# Patient Record
Sex: Male | Born: 1975 | Race: White | Hispanic: No
Health system: Midwestern US, Academic
[De-identification: ages and names within clinical notes are randomized; demographics above are authoritative.]

## PROBLEM LIST (undated history)

## (undated) DIAGNOSIS — F17200 Nicotine dependence, unspecified, uncomplicated: Secondary | ICD-10-CM

## (undated) DIAGNOSIS — Z794 Long term (current) use of insulin: Secondary | ICD-10-CM

## (undated) DIAGNOSIS — E119 Type 2 diabetes mellitus without complications: Principal | ICD-10-CM

## (undated) DIAGNOSIS — E118 Type 2 diabetes mellitus with unspecified complications: Principal | ICD-10-CM

---

## 2014-11-30 IMAGING — CR MASTOIDS 3 VWS
2 series · 7 of 7 positions shown · non-contrast
Comparison: None.

HISTORY: Mastoiditis, left side.
TECHNIQUE: Plain x-rays of mastoids. 7 views.

[Series 1: stenvers · 0.17mm/px · 6 of 6 slices shown]
[im 1/6]
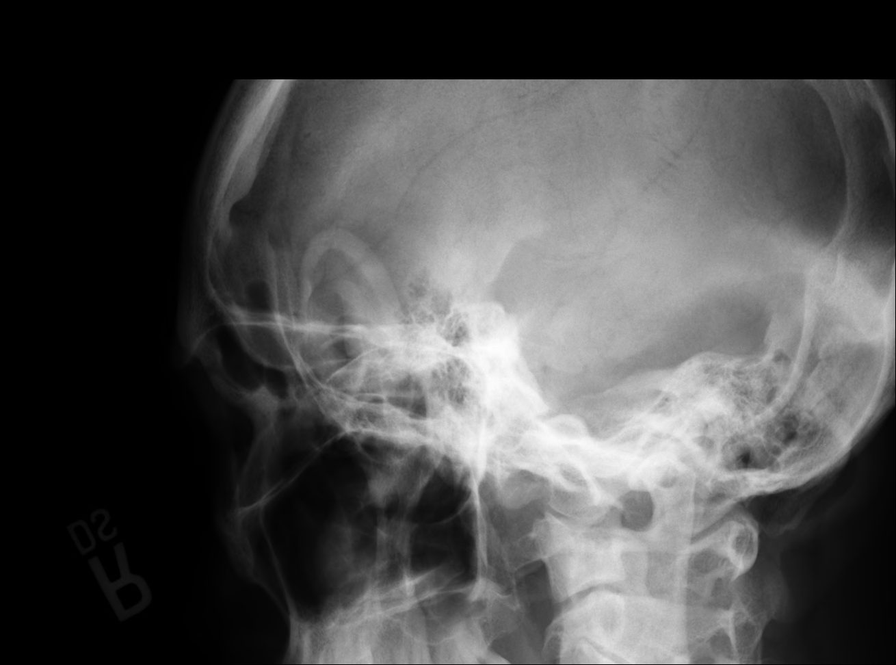
[im 2/6]
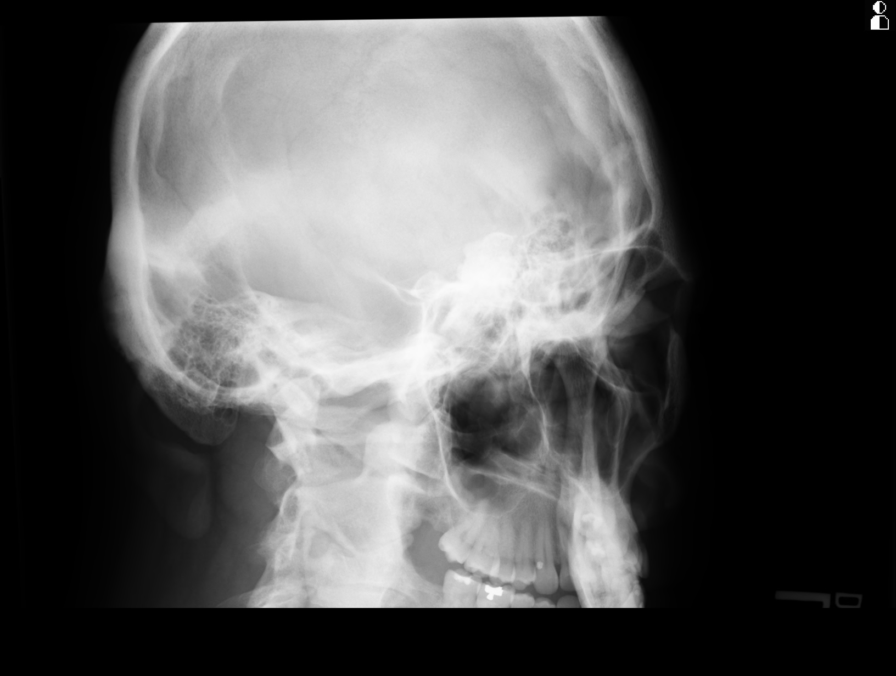
[im 3/6]
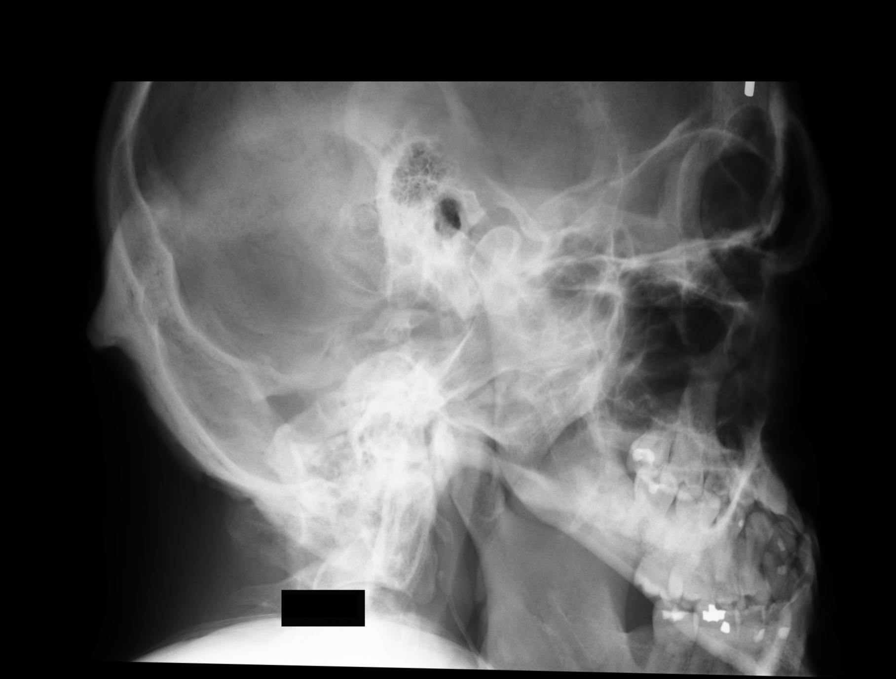
[im 4/6]
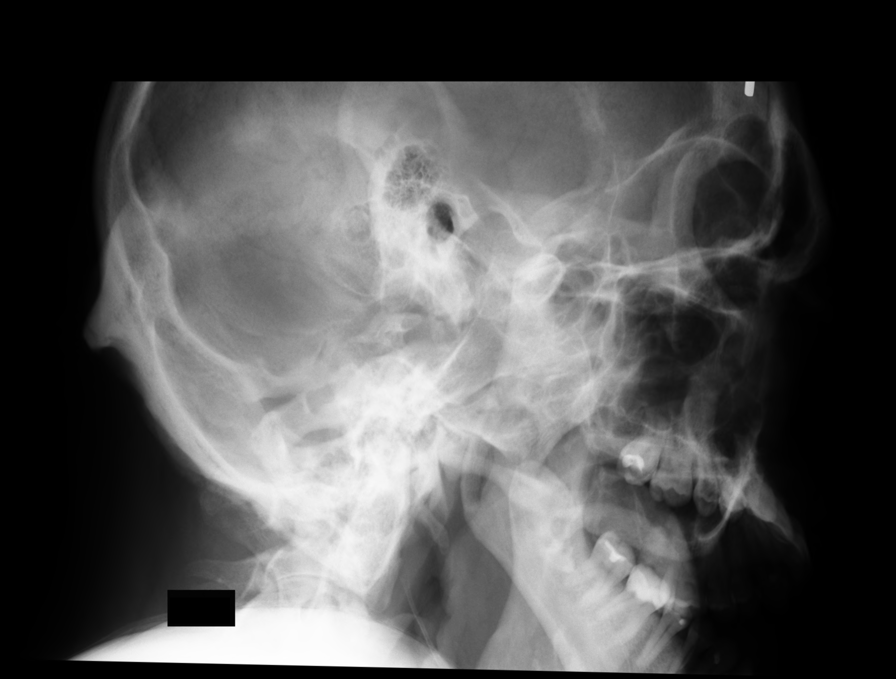
[im 5/6]
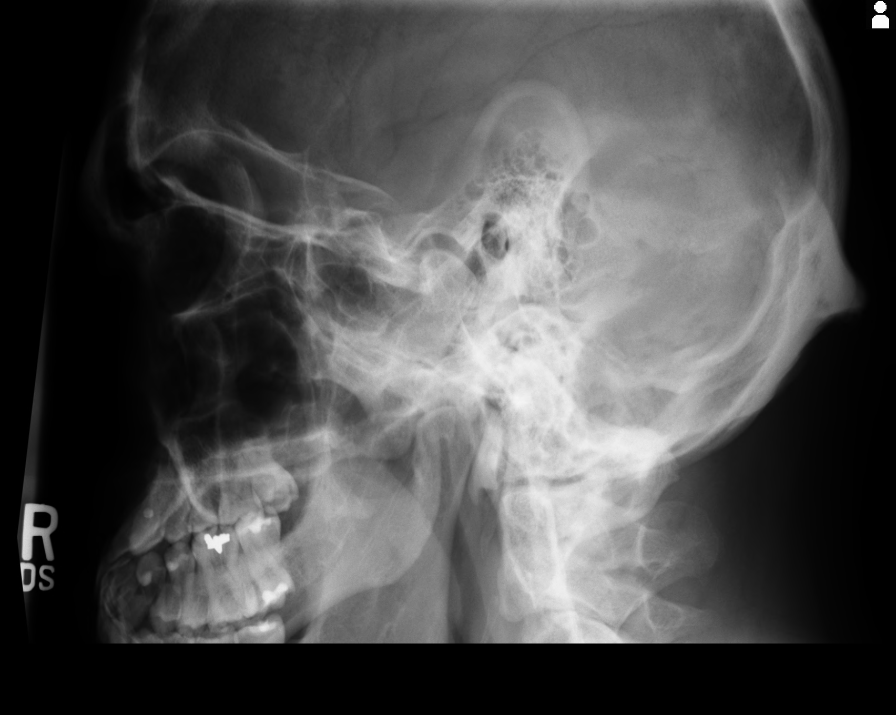
[im 6/6]
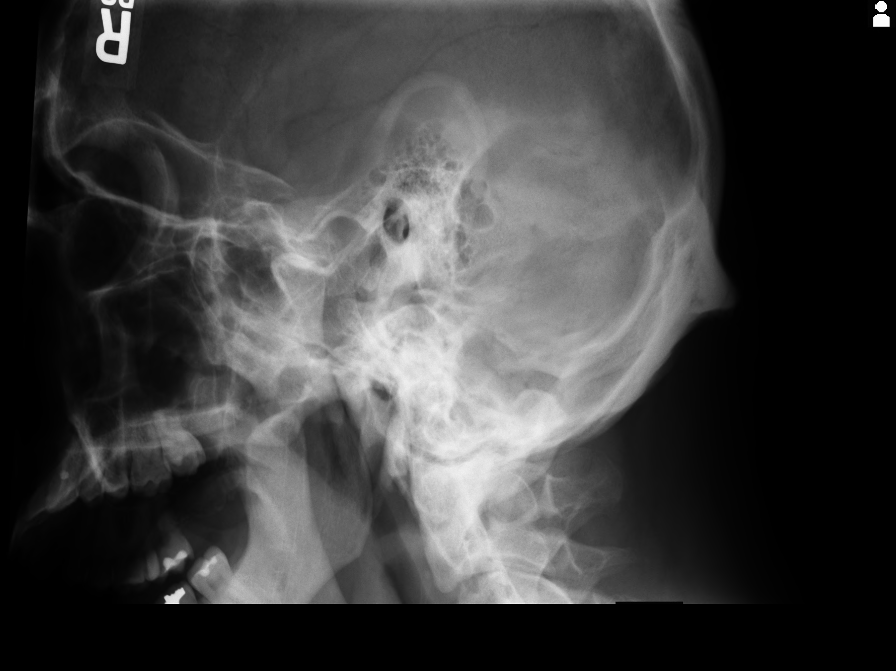

[[person_name]]
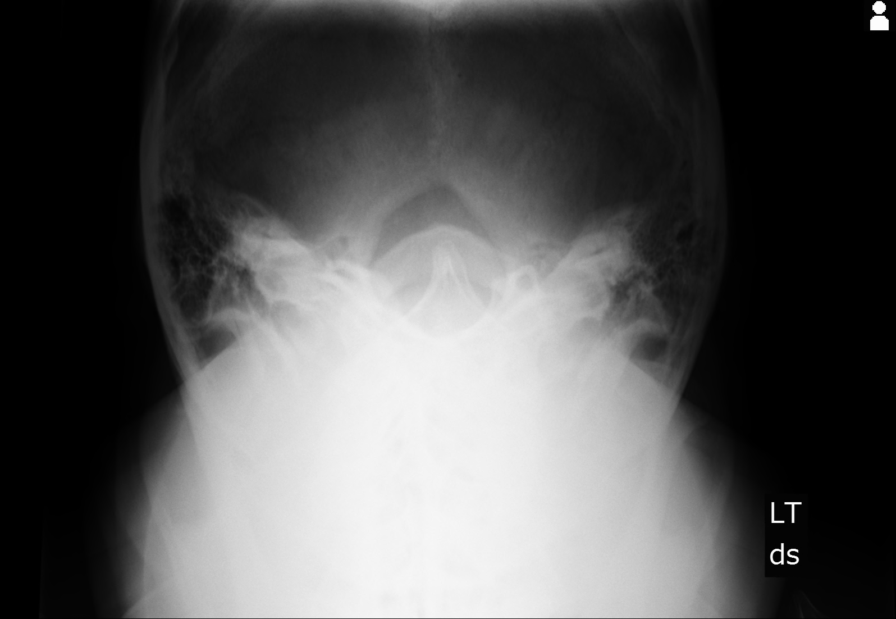

[7 of 7 positions shown; findings below may reference images not displayed]

FINDINGS: Plain x-rays of mastoids suggest there is haziness of the left mastoid air cells. Detail is limited. Consider other studies for better visualization such as CT scan.
IMPRESSION: Suggest haziness of left mastoid air cells compared to right. Limited visualization.

## 2014-12-07 IMAGING — CT Head^IAC_SUPINE (Adult)
2 of 6 series · 11 of 40 positions shown, 13 images · IV contrast (APPLIED)
Comparison: None

HISTORY: 38 year old Male. Acute mastoiditis, left ear
TECHNIQUE: Multiple helical CT images of the temporal bones were acquired before and after the administration of IV contrast.  Multiplanar reformatted images were also generated.

CONTRAST: 100 mL Optiray 320. Serum creatinine 1.2 mg/dL

[Series 5: coronal · coronal · 0.15mm/px · 3 of 287 slices shown]
[im 72/287  brain]
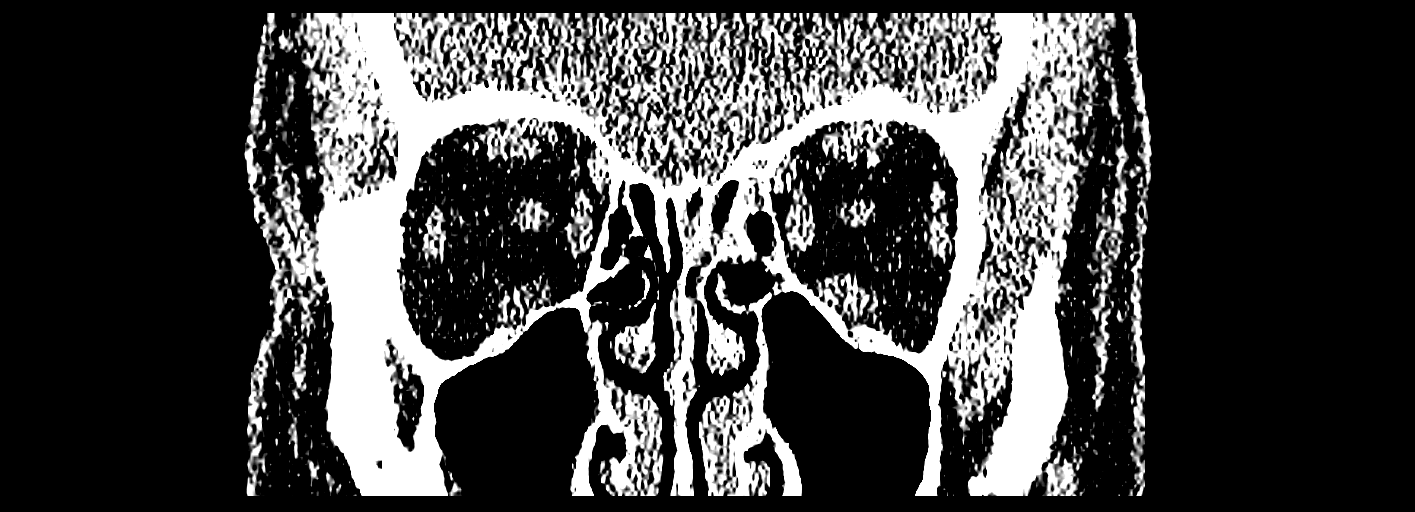
[im 144/287  brain]
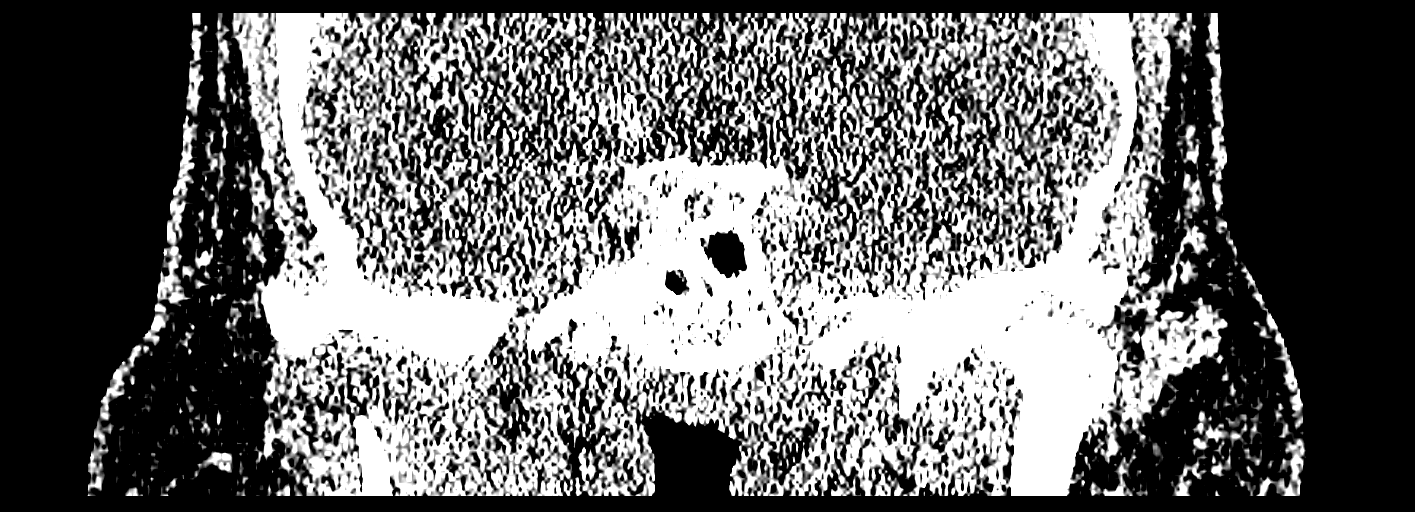
[im 215/287  brain]
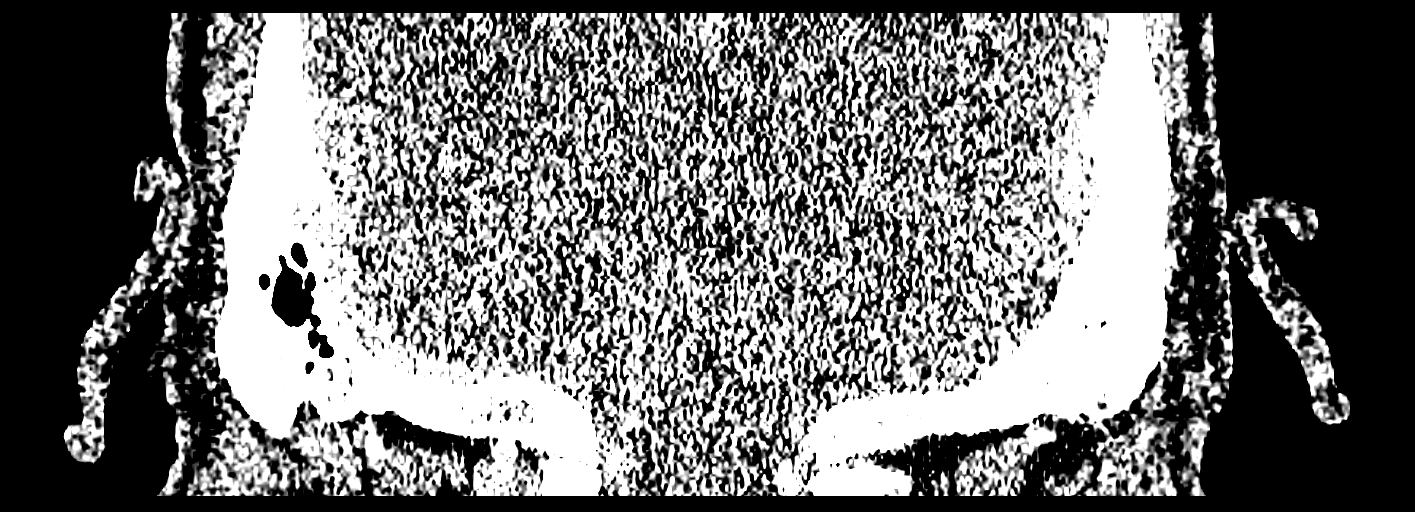

[Series 6: rt iac · axial · 0.16mm/px · z∈[-114,-62]mm · 8 of 113 slices shown, 10 images]
[im 13/113  brain]
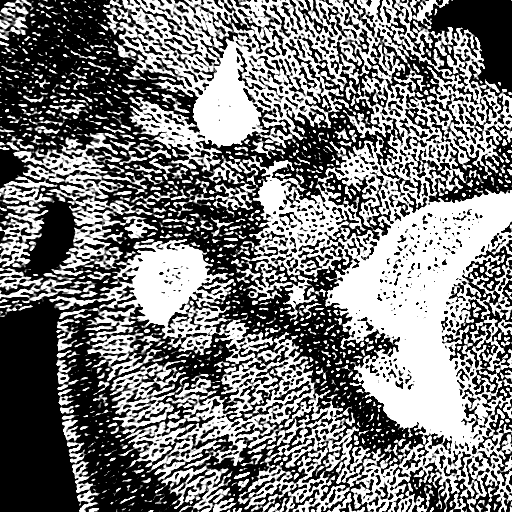
[im 13/113  bone]
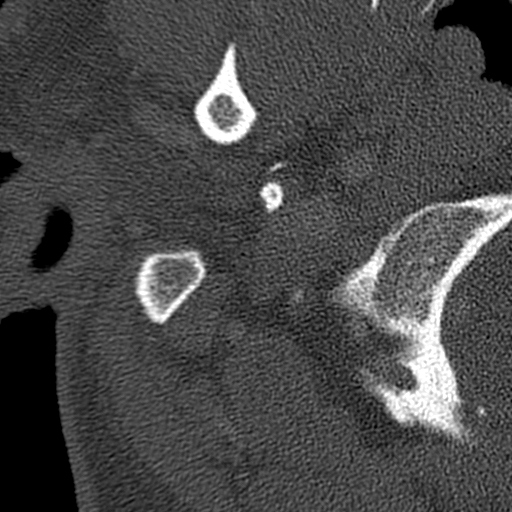
[im 25/113  brain]
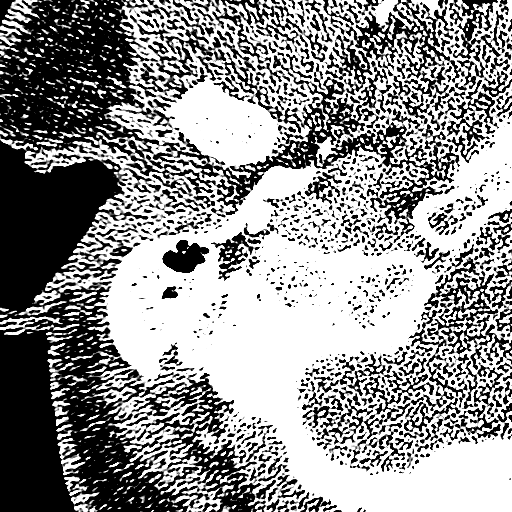
[im 38/113  brain]
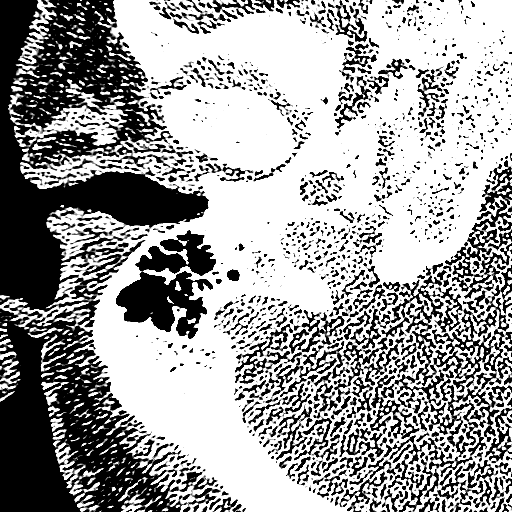
[im 50/113  brain]
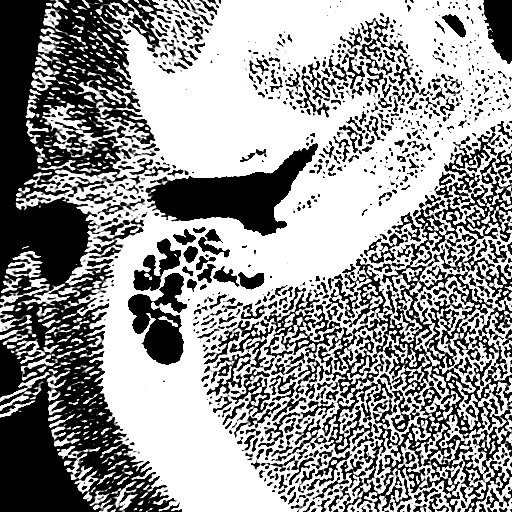
[im 63/113  brain]
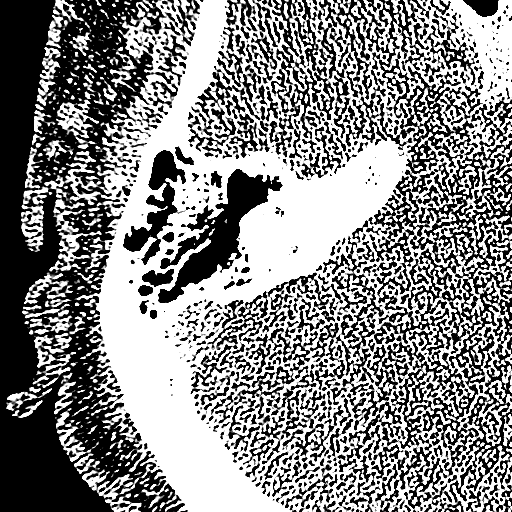
[im 63/113  bone]
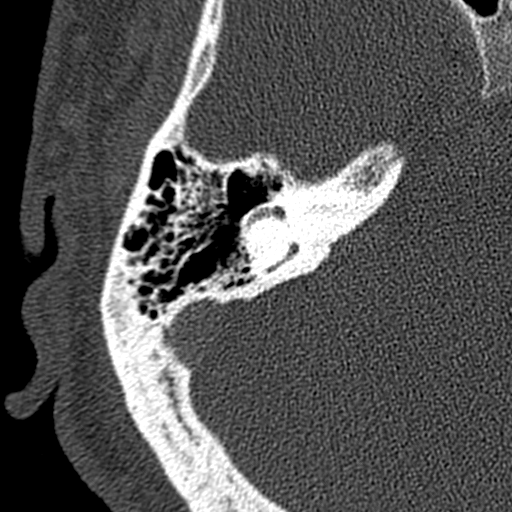
[im 75/113  brain]
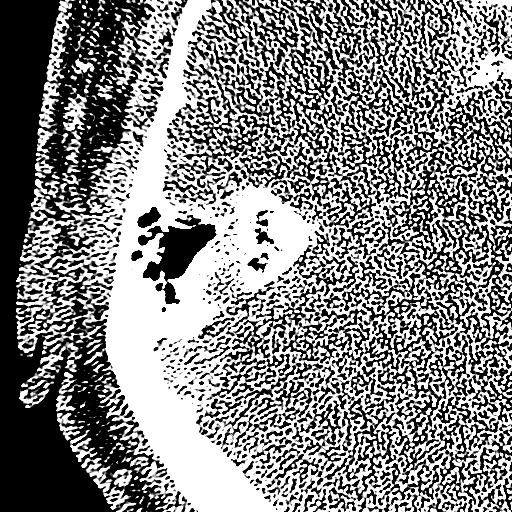
[im 88/113  brain]
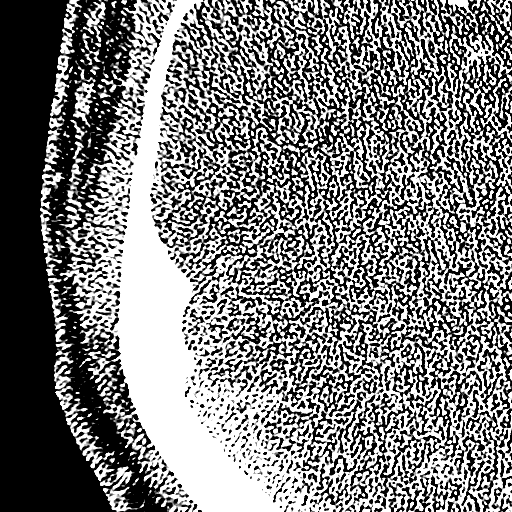
[im 100/113  brain]
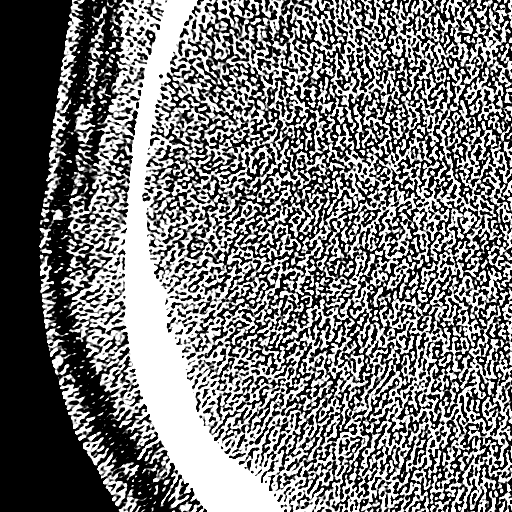

[11 of 40 positions shown; findings below may reference images not displayed]

FINDINGS: RIGHT: The external auditory canal is patent. The tympanic membrane is not thickened or retracted. The ossicles are intact without erosions. The middle ear and mastoid air cells are well aerated. The scutum is sharp. The cochlea, vestibule, and semicircular canals are normal. The bony internal auditory canal, carotid canal, and facial nerve canal are unremarkable.

LEFT: The external auditory canal is patent. The tympanic membrane is not thickened or retracted. The ossicles are intact without erosions. The scutum is sharp, and the epitympanum is clear. The mastoid air cells are well aerated. The cochlea, vestibule, and semicircular canals are unremarkable. The bony internal auditory canal, carotid canal, and facial nerve canal are normal.

OTHER: Mild mucosal thickening observed throughout the paranasal sinuses. The orbits and visualized brain parenchyma are normal.
IMPRESSION: Negative CT evaluation of the temporal bones. Normal mastoid air cells and middle ears.

Paranasal sinus mucosal thickening.

## 2014-12-27 IMAGING — US US LIVER/GALLBLADDER
1 series · 14 of 21 positions shown · non-contrast
Comparison: None.

HISTORY: 38 year-old Male with Elevated LFTs, fatty liver.
TECHNIQUE: Using real-time and Doppler ultrasound, the liver and gallbladder were evaluated.

[Series 1: us liver/gallbladder · 0.32mm/px · 14 of 21 slices shown]
[im 1/21]
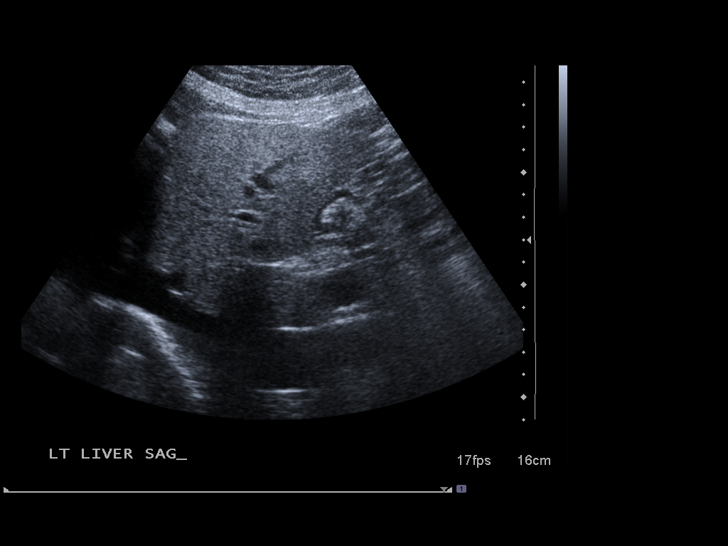
[im 3/21]
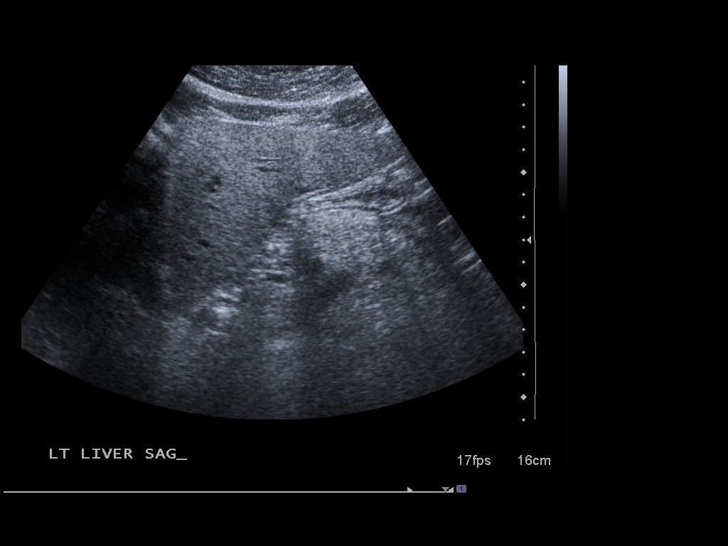
[im 4/21]
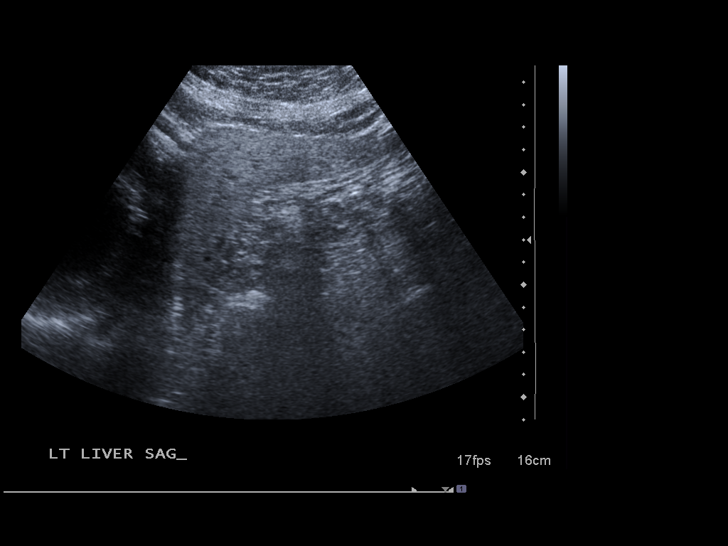
[im 6/21]
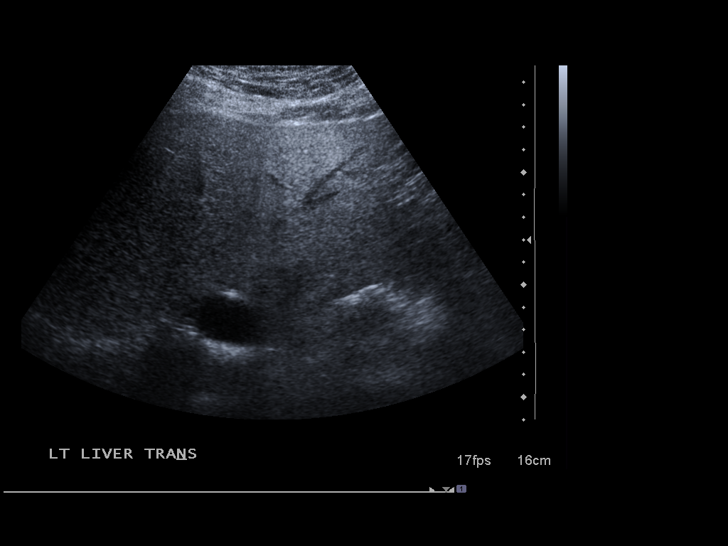
[im 7/21]
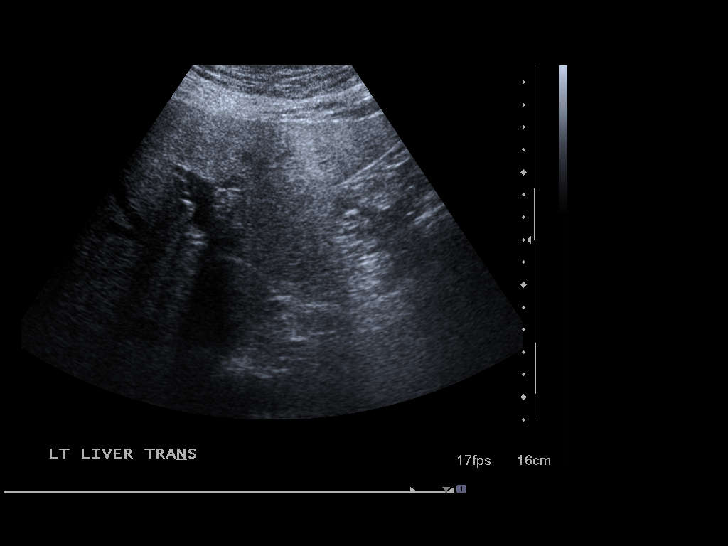
[im 9/21]
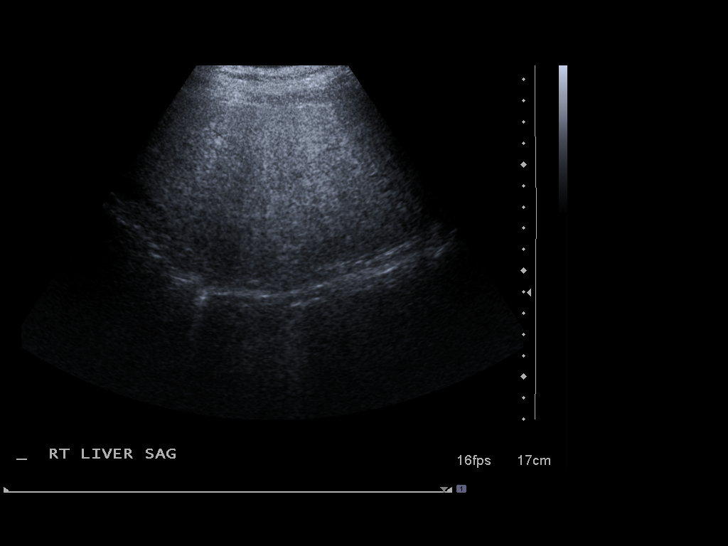
[im 10/21]
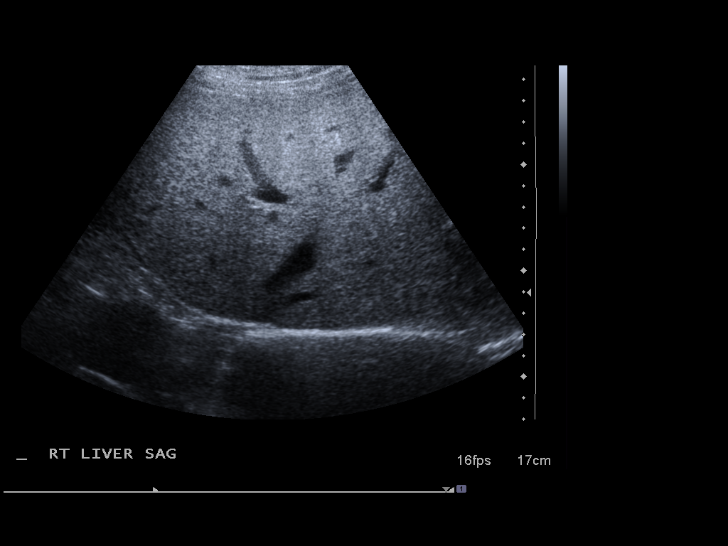
[im 12/21]
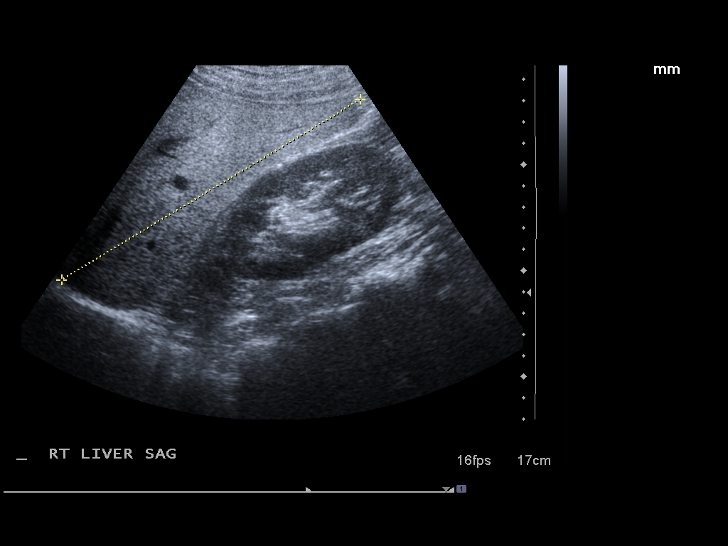
[im 13/21]
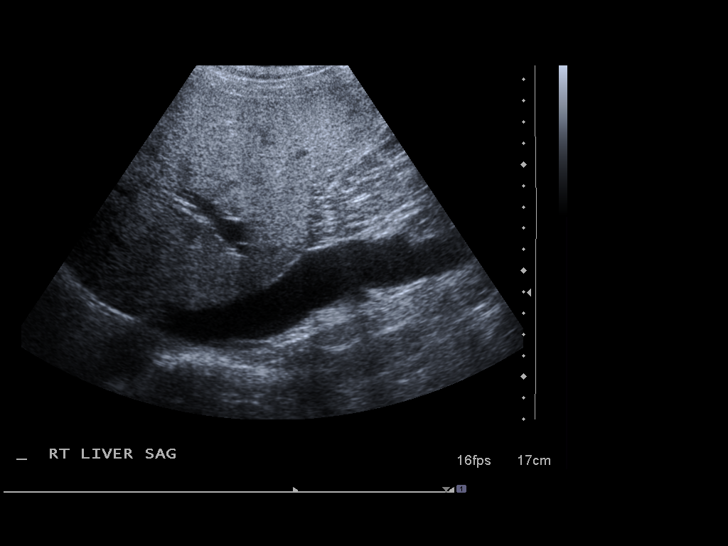
[im 15/21]
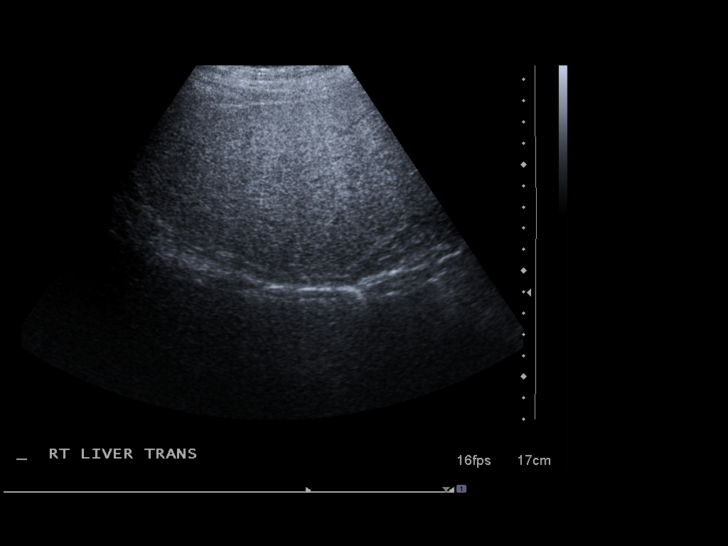
[im 16/21]
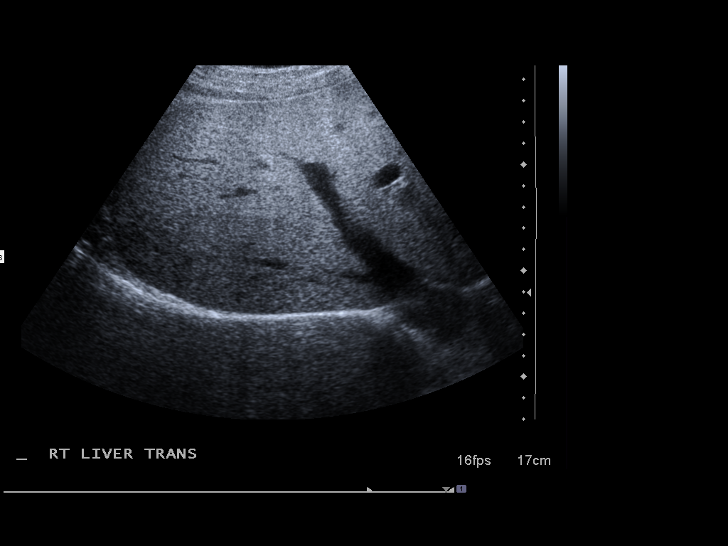
[im 18/21]
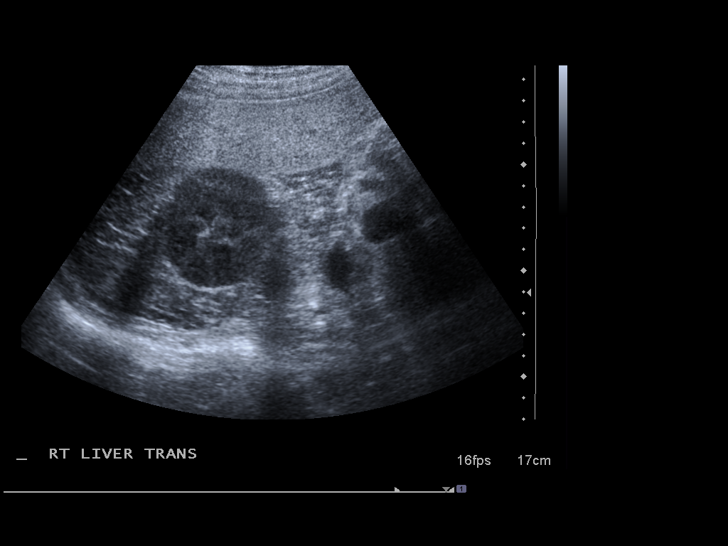
[im 19/21]
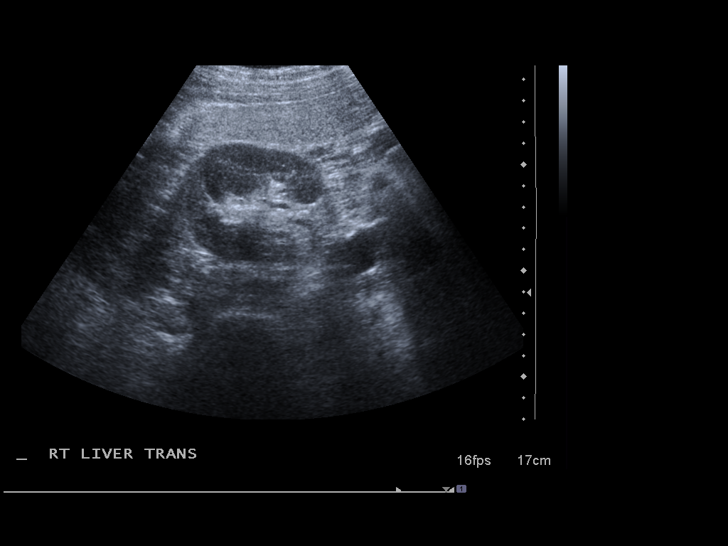
[im 21/21]
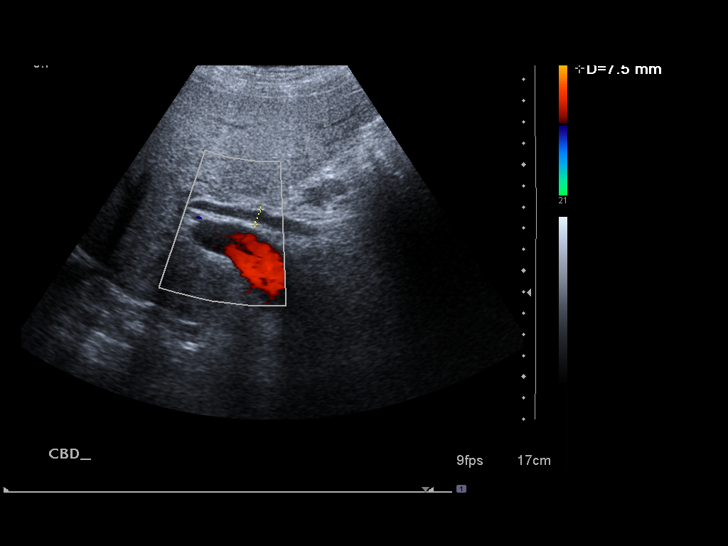

[14 of 21 positions shown; findings below may reference images not displayed]

FINDINGS: The liver measures 164 mm and is within normal limits. There is increased echogenicity involving the liver.

Patient is status post cholecystectomy. There is negative sonographic Murphy's sign. The common bile duct measures 7.5 mm.
IMPRESSION: 1. Patient is status post cholecystectomy.

2. The common bile duct is at the upper limits of normal.

## 2017-04-14 ENCOUNTER — Ambulatory Visit: Admit: 2017-04-14 | Discharge: 2017-04-14 | Payer: BLUE CROSS/BLUE SHIELD | Attending: Family Medicine

## 2017-04-14 DIAGNOSIS — Z794 Long term (current) use of insulin: Secondary | ICD-10-CM

## 2017-04-14 LAB — POCT URINALYSIS DIPSTICK
Bilirubin, UA: NEGATIVE
Blood, UA POC: NEGATIVE
Glucose, UA POC: 500
Ketones, UA: NEGATIVE
Leukocytes, UA: NEGATIVE
Nitrite, UA: NEGATIVE
Spec Grav, UA: 1.025
Urobilinogen, UA: 0.2
pH, UA: 5.5

## 2017-04-14 LAB — POCT MICROALBUMIN
Creatinine Ur POCT: 200
Microalb, Ur: 80

## 2017-04-14 NOTE — Progress Notes (Signed)
04/14/2017    Chief Complaint   Patient presents with   . New Patient     here to est with new PCP, was seeing a dr in [place]   . Diabetes     states he needs his meds adjusted, blood sugars are up and down 107-218, brought his readings on a phone app, pt having tingling in both feet started 2 months ago       HPI:  Patient is here for the first time he has a history of type 2 diabetes studies had for about 2 years.  He states he is not really watching his diet very closely but he does check his blood sugar on a regular basis and it usually runs in the 120s to 140 range that is often in the morning fasting and 2 hours after eating.  He's a prescription for lancets. He takes 80 units of Levemir in the evening along with Jardiance 10 mg a day.  He had been on metformin but had GI side effects so that was stopped.  He has also been on Januvia but that was stopped and changed to Jardiance he's not sure why.  He has not had a hemoglobin A1c for over a year.  Denies chest pain shortness of breath nausea vomiting diarrhea fever chills polyuria polydipsia fatigue or depression.  He does have some tingling in the toes of both his feet on occasion.  He smokes a pack and a half a day for about 30 years he has tried Chantix in the past and it worked for a while but then he restarted smoking he'll consider stopping smoking in the future.    Review of Systems   Constitutional: Negative for activity change, appetite change, chills and unexpected weight change.   HENT: Negative for congestion, ear pain, hearing loss, sinus pressure, sore throat and trouble swallowing.    Eyes: Negative.  Negative for photophobia, pain, discharge, itching and visual disturbance.   Respiratory: Negative.  Negative for apnea, cough, chest tightness, shortness of breath and wheezing.    Cardiovascular: Negative.  Negative for chest pain, palpitations and leg swelling.   Gastrointestinal: Negative.  Negative for abdominal distention, abdominal pain,  constipation, diarrhea, nausea and vomiting.   Endocrine: Negative.  Negative for cold intolerance, heat intolerance, polydipsia, polyphagia and polyuria.   Genitourinary: Negative for difficulty urinating, dysuria, frequency and urgency.   Musculoskeletal: Negative.  Negative for arthralgias, back pain, gait problem and joint swelling.   Skin: Negative.  Negative for color change, pallor, rash and wound.   Allergic/Immunologic: Negative for food allergies.   Neurological: Negative.  Negative for dizziness, weakness, light-headedness, numbness and headaches.   Hematological: Negative.  Negative for adenopathy.   Psychiatric/Behavioral: Negative.  Negative for agitation, behavioral problems and confusion.       No Known Allergies  Prior to Visit Medications    Medication Sig Taking? Authorizing Provider   empagliflozin (JARDIANCE) 10 MG tablet Take 10 mg by mouth daily Yes Historical Provider, MD   rosuvastatin (CRESTOR) 20 MG tablet Take 20 mg by mouth daily Yes Historical Provider, MD   insulin detemir (LEVEMIR FLEXPEN) 100 UNIT/ML injection pen Inject 80 Units into the skin nightly Yes Historical Provider, MD   Fexofenadine-Pseudoephedrine (ALLEGRA-D 24 HOUR PO) Take 1 tablet by mouth daily Yes Historical Provider, MD     Current Outpatient Prescriptions   Medication Sig Dispense Refill   . empagliflozin (JARDIANCE) 10 MG tablet Take 10 mg by mouth daily     .  rosuvastatin (CRESTOR) 20 MG tablet Take 20 mg by mouth daily     . insulin detemir (LEVEMIR FLEXPEN) 100 UNIT/ML injection pen Inject 80 Units into the skin nightly     . Fexofenadine-Pseudoephedrine (ALLEGRA-D 24 HOUR PO) Take 1 tablet by mouth daily       No current facility-administered medications for this visit.      Health Maintenance   Topic Date Due   . HIV screen  05/11/1989   . DTaP/Tdap/Td vaccine (1 - Tdap) 05/11/1993   . Lipid screen  05/11/2014   . Diabetes screen  05/11/2014   . Flu vaccine (1) 04/19/2017      Social History     Social  History   . Marital status: Single     Spouse name: N/A   . Number of children: N/A   . Years of education: N/A     Social History Main Topics   . Smoking status: Current Every Day Smoker     Packs/day: 1.50     Years: 30.00   . Smokeless tobacco: Current User     Types: Chew   . Alcohol use Yes      Comment: 1-3 beers a day   . Drug use: No   . Sexual activity: Not Asked     Other Topics Concern   . None     Social History Narrative   . None     Family History   Problem Relation Age of Onset   . Diabetes Mother    . Diabetes Father    . Heart Disease Father    . Diabetes Maternal Grandmother    . Heart Disease Paternal Grandmother    . Arrhythmia Paternal Grandmother      There is no problem list on file for this patient.       LABS:  No results found for any previous visit.          PHYSICAL EXAM  BP (!) 134/100 (Site: Right Arm, Position: Sitting)   Pulse 98   Resp 17   Ht 6\' 2"  (1.88 m)   Wt 260 lb (117.9 kg)   BMI 33.38 kg/m     BP Readings from Last 3 Encounters:   04/14/17 (!) 134/100       Wt Readings from Last 3 Encounters:   04/14/17 260 lb (117.9 kg)        Physical Exam   Constitutional: He is oriented to person, place, and time. He appears well-developed and well-nourished. No distress.   HENT:   Head: Normocephalic and atraumatic.   Right Ear: External ear normal.   Left Ear: External ear normal.   Nose: Nose normal.   Mouth/Throat: Oropharynx is clear and moist. No oropharyngeal exudate.   Eyes: Pupils are equal, round, and reactive to light. Conjunctivae and EOM are normal. Right eye exhibits no discharge. Left eye exhibits no discharge. No scleral icterus.   Neck: Normal range of motion. Neck supple. No JVD present. No tracheal deviation present. No thyromegaly present.   Cardiovascular: Normal rate and regular rhythm.  Exam reveals no gallop and no friction rub.    No murmur heard.  Pulmonary/Chest: Effort normal and breath sounds normal. No stridor. No respiratory distress. He has no  wheezes. He has no rales. He exhibits no tenderness.   Abdominal: Soft. Bowel sounds are normal. He exhibits no distension and no mass. There is no tenderness. There is no rebound and no guarding.   Musculoskeletal: Normal  range of motion. He exhibits no edema, tenderness or deformity.   Lymphadenopathy:     He has no cervical adenopathy.   Neurological: He is alert and oriented to person, place, and time. He has normal reflexes. No cranial nerve deficit. He exhibits normal muscle tone. Coordination normal.   Decreased sensory in left right feet with 10 gram microfilamet   Skin: Skin is warm and dry. No rash noted. He is not diaphoretic. No erythema. No pallor.   Psychiatric: He has a normal mood and affect. His behavior is normal. Judgment and thought content normal.   Vitals reviewed.      ASSESSMENT/PLAN:  Kenyen was seen today for new patient and diabetes.    Diagnoses and all orders for this visit:    Type 2 diabetes mellitus with complication, with long-term current use of insulin (HCC)  -     Hemoglobin A1C; Future  -     T4, Free; Future  -     TSH without Reflex; Future  -     POCT Urinalysis no Micro  -     POCT microalbumin  -     EKG 12 Lead  Patient is not fasting today so one ahead and checked his EKG urine for protein and UA.  He'll come in tomorrow to have fasting hemoglobin A1c comprehensive lipids and thyroid function and PSA checked.  I'll follow-up with him next week to go over all the results area did discuss considering trying to put him on standard release metformin.  Also discussed GLP 1's, SGLT 2's,  TZD's and DPP4's.  I did not change his his Levemir 80 units nightly or his events grams daily until I can go over his lab work and see him in a week or so.  Given referral to Dr. Lizabeth Leyden for eye exam.  I referred him to [facility] for diabetic education.  Recommend a flu shot and a pneumonia vaccine he will consider both of them.  Hyperlipidemia, unspecified hyperlipidemia type  -      Comprehensive Metabolic Panel, Fasting; Future  -     CBC Auto Differential; Future  -     Lipid, Fasting; Future  Check his fasting lipids.  His goal LDL should be at least under 100.  I'll review the results after he has the blood work drawn.  Class 1 obesity with serious comorbidity and body mass index (BMI) of 33.0 to 33.9 in adult, unspecified obesity type  Stress lifestyle medication increased exercise decrease weight.  After a see his lab work would consider a ketogenic diet.  Prostate cancer screening  -     Psa screening; Future  DuoNeb prostate cancer screening since he is over age 75 review that when he comes back in.  Tobacco use disorder  Patient is currently smoking one 4.5 packs per day for around 30 years.  Recommend quit smoking he'll consider redoing Chantix in the future I recommend a low-dose CT of his chest starting around age 17-55.  Also recommended he get a pneumonia vaccine and a flu shot.  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  We'll look at his lab work when he comes in next week to review it I would very likely start him on an ACE inhibitor or an ARB.  Patient states he thinks he was on lisinopril in the past and it may have caused him ED.  Hypertension  Blood pressure is high today on recheck it when he comes in  in a week to review all his labs.  I rechecked his blood pressure was 124/80 in the office.    Today's visit took over 60 minutes      No Follow-up on file.  Reviewed and/or ordered clinical lab results Yes  Reviewed and/or ordered radiology tests No  Reviewed and/or ordered other diagnostic tests YES  Discussed test results with performing physician No  Independently reviewed image, tracing, or specimen No  Made a decision to obtain old records No  Reviewed and summarized old records No      Jaxsen Bernhart was counseled regarding symptoms of current diagnosis, course and complications of disease if inadequately treated, side effects of medications, diagnosis,  treatment options, and prognosis, risks, benefits, complications, and alternatives of treatment, labs, imaging and other studies and treatment targets and goals.  He understands instructions and counseling.      This dictation was performed with a verbal recognition program (DRAGON) and it was checked for errors. It is possible that there are still dictated errors within this office note. If so, please bring any errors to my attention for an addendum. All efforts were made to ensure that this office note is accurate.

## 2017-04-16 ENCOUNTER — Encounter

## 2017-04-16 LAB — CBC WITH AUTO DIFFERENTIAL
Basophils %: 0.6 %
Basophils Absolute: 0.1 10*3/uL (ref 0.0–0.2)
Eosinophils %: 1.7 %
Eosinophils Absolute: 0.2 10*3/uL (ref 0.0–0.6)
Hematocrit: 50.2 % (ref 40.5–52.5)
Hemoglobin: 16.5 g/dL (ref 13.5–17.5)
Lymphocytes %: 37.1 %
Lymphocytes Absolute: 3.3 10*3/uL (ref 1.0–5.1)
MCH: 31.5 pg (ref 26.0–34.0)
MCHC: 32.9 g/dL (ref 31.0–36.0)
MCV: 95.8 fL (ref 80.0–100.0)
MPV: 9.3 fL (ref 5.0–10.5)
Monocytes %: 5 %
Monocytes Absolute: 0.4 10*3/uL (ref 0.0–1.3)
Neutrophils %: 55.6 %
Neutrophils Absolute: 4.9 10*3/uL (ref 1.7–7.7)
Platelets: 209 10*3/uL (ref 135–450)
RBC: 5.24 M/uL (ref 4.20–5.90)
RDW: 14.8 % (ref 12.4–15.4)
WBC: 8.9 10*3/uL (ref 4.0–11.0)

## 2017-04-17 LAB — COMPREHENSIVE METABOLIC PANEL, FASTING
ALT: 64 U/L — ABNORMAL HIGH (ref 10–40)
AST: 56 U/L — ABNORMAL HIGH (ref 15–37)
Albumin/Globulin Ratio: 1.6 (ref 1.1–2.2)
Albumin: 4.6 g/dL (ref 3.4–5.0)
Alkaline Phosphatase: 70 U/L (ref 40–129)
Anion Gap: 14 (ref 3–16)
BUN: 15 mg/dL (ref 7–20)
CO2: 24 mmol/L (ref 21–32)
Calcium: 9.7 mg/dL (ref 8.3–10.6)
Chloride: 103 mmol/L (ref 99–110)
Creatinine: 0.8 mg/dL — ABNORMAL LOW (ref 0.9–1.3)
GFR African American: >60 (ref >60)
GFR Non-African American: >60 (ref >60)
Globulin: 2.9 g/dL
Glucose, Fasting: 141 mg/dL — ABNORMAL HIGH (ref 70–99)
Potassium: 4.6 mmol/L (ref 3.5–5.1)
Sodium: 141 mmol/L (ref 136–145)
Total Bilirubin: <0.2 mg/dL (ref 0.0–1.0)
Total Protein: 7.5 g/dL (ref 6.4–8.2)

## 2017-04-17 LAB — LDL CHOLESTEROL, DIRECT: LDL Direct: 51 mg/dL (ref <100)

## 2017-04-17 LAB — LIPID, FASTING
Cholesterol, Fasting: 119 mg/dL (ref 0–199)
HDL: 17 mg/dL — ABNORMAL LOW (ref 40–60)
Triglyceride, Fasting: 455 mg/dL — ABNORMAL HIGH (ref 0–150)

## 2017-04-17 LAB — T4, FREE: T4 Free: 1.3 ng/dL (ref 0.9–1.8)

## 2017-04-17 LAB — HEMOGLOBIN A1C
Hemoglobin A1C: 7.2 %
eAG: 159.9 mg/dL

## 2017-04-17 LAB — PSA SCREENING: PSA: 0.76 ng/mL (ref 0.00–4.00)

## 2017-04-17 LAB — TSH: TSH: 1.25 u[IU]/mL (ref 0.27–4.20)

## 2017-04-19 ENCOUNTER — Inpatient Hospital Stay

## 2017-04-22 ENCOUNTER — Encounter

## 2017-04-22 NOTE — Telephone Encounter (Signed)
LMOM that order was placed into Epic for patient's Diabetes Education

## 2017-04-22 NOTE — Telephone Encounter (Signed)
A representative from [facility] Scheduling called about an order for Diabetic Education at [facility] for the patient. She says there just needs to be a referral or an order in Epic in order to schedule him.    She can be reached at [phone] (her direct line). She said it's ok to leave a voicemail.

## 2017-04-22 NOTE — Telephone Encounter (Signed)
Order paced in EPic

## 2017-04-22 NOTE — Telephone Encounter (Signed)
Order has been placed.

## 2017-04-24 ENCOUNTER — Ambulatory Visit: Admit: 2017-04-24 | Discharge: 2017-04-24 | Payer: BLUE CROSS/BLUE SHIELD | Attending: Family Medicine

## 2017-04-24 ENCOUNTER — Encounter

## 2017-04-24 DIAGNOSIS — Z794 Long term (current) use of insulin: Secondary | ICD-10-CM

## 2017-04-24 MED ORDER — METFORMIN HCL ER 500 MG PO TB24
500 MG | ORAL_TABLET | Freq: Every day | ORAL | 1 refills | Status: DC
Start: 2017-04-24 — End: 2017-06-02

## 2017-04-24 MED ORDER — VARENICLINE TARTRATE 0.5 MG X 11 & 1 MG X 42 PO MISC
0.5 MG X 11 & 1 MG X 42 | ORAL | 0 refills | Status: DC
Start: 2017-04-24 — End: 2017-06-02

## 2017-04-24 MED ORDER — ROSUVASTATIN CALCIUM 20 MG PO TABS
20 | ORAL_TABLET | Freq: Every day | ORAL | 1 refills | Status: AC
Start: 2017-04-24 — End: ?

## 2017-04-24 MED ORDER — EMPAGLIFLOZIN 25 MG PO TABS
25 MG | ORAL_TABLET | Freq: Every day | ORAL | 1 refills | Status: DC
Start: 2017-04-24 — End: 2017-06-02

## 2017-04-24 MED ORDER — INSULIN DETEMIR 100 UNIT/ML SC SOPN
100 | PEN_INJECTOR | Freq: Every evening | SUBCUTANEOUS | 5 refills | Status: DC
Start: 2017-04-24 — End: 2017-06-02

## 2017-04-24 MED ORDER — LISINOPRIL 5 MG PO TABS
5 MG | ORAL_TABLET | Freq: Every day | ORAL | 1 refills | Status: AC
Start: 2017-04-24 — End: ?

## 2017-04-24 NOTE — Telephone Encounter (Signed)
Patient can start the starter pack and we'll check him again in about 3 weeks to see how he is doing and then I will give him continuation packs with refills

## 2017-04-24 NOTE — Telephone Encounter (Signed)
PT WAS OFFERED CHANTIX AT LAST APPT BUT WANTED TO THINK ABOUT IT. PT DECIDED HE WANTS TO TRY IT AND WOULD LIKE AN RX CALLED IN

## 2017-04-24 NOTE — Progress Notes (Signed)
04/24/2017    Chief Complaint   Patient presents with   . Diabetes     labs done 04-16-17, states BS runs around 140's in morning, here to discuss if meds will remain same, pt will need RFs if left on same meds   . Hypertension   . Hyperlipidemia       HPI:  Patient is here for follow-up his type 2 diabetes.  His blood sugar in the morning is running around 140 taking 80 units of Levemir.  He is also taking 10 of Jardiance.  He is trying to watch his diet and exercise she's lost 2 pounds.  Denies chest pain shortness of breath nausea vomiting diarrhea fever chills polyuria polydipsia.  He is here with his fiance and he is requesting a referral to a psychiatrist for him and his fiance to see for depression and anxiety symptoms.    Review of Systems   Constitutional: Negative for activity change, appetite change, chills and unexpected weight change.   HENT: Negative for congestion, ear pain, hearing loss, sinus pressure, sore throat and trouble swallowing.    Eyes: Negative.  Negative for photophobia, pain, discharge, itching and visual disturbance.   Respiratory: Negative.  Negative for apnea, cough, chest tightness, shortness of breath and wheezing.    Cardiovascular: Negative.  Negative for chest pain, palpitations and leg swelling.   Gastrointestinal: Negative.  Negative for abdominal distention, abdominal pain, constipation, diarrhea, nausea and vomiting.   Endocrine: Negative.  Negative for cold intolerance, heat intolerance, polydipsia, polyphagia and polyuria.   Genitourinary: Negative for difficulty urinating, dysuria, frequency and urgency.   Musculoskeletal: Negative.  Negative for arthralgias, back pain, gait problem and joint swelling.   Skin: Negative.  Negative for color change, pallor, rash and wound.   Allergic/Immunologic: Negative for food allergies.   Neurological: Negative.  Negative for dizziness, weakness, light-headedness, numbness and headaches.   Hematological: Negative.  Negative for  adenopathy.   Psychiatric/Behavioral: Negative.  Negative for agitation, behavioral problems and confusion.       No Known Allergies  Prior to Visit Medications    Medication Sig Taking? Authorizing Provider   esomeprazole Magnesium (NEXIUM) 40 MG PACK Take 40 mg by mouth daily Yes Historical Provider, MD   metFORMIN (GLUCOPHAGE XR) 500 MG extended release tablet Take 4 tablets by mouth Daily with supper Yes Warrick Parisian, MD   lisinopril (PRINIVIL;ZESTRIL) 5 MG tablet Take 1 tablet by mouth daily Yes Warrick Parisian, MD   rosuvastatin (CRESTOR) 20 MG tablet Take 1 tablet by mouth daily Yes Warrick Parisian, MD   insulin detemir (LEVEMIR FLEXPEN) 100 UNIT/ML injection pen Inject 100 Units into the skin nightly Yes Warrick Parisian, MD   empagliflozin (JARDIANCE) 25 MG tablet Take 25 mg by mouth daily Yes Warrick Parisian, MD   empagliflozin (JARDIANCE) 10 MG tablet Take 10 mg by mouth daily Yes Historical Provider, MD   Fexofenadine-Pseudoephedrine (ALLEGRA-D 24 HOUR PO) Take 1 tablet by mouth daily Yes Historical Provider, MD     Current Outpatient Prescriptions   Medication Sig Dispense Refill   . esomeprazole Magnesium (NEXIUM) 40 MG PACK Take 40 mg by mouth daily     . metFORMIN (GLUCOPHAGE XR) 500 MG extended release tablet Take 4 tablets by mouth Daily with supper 120 tablet 1   . lisinopril (PRINIVIL;ZESTRIL) 5 MG tablet Take 1 tablet by mouth daily 90 tablet 1   . rosuvastatin (CRESTOR) 20 MG tablet Take 1 tablet by mouth daily  90 tablet 1   . insulin detemir (LEVEMIR FLEXPEN) 100 UNIT/ML injection pen Inject 100 Units into the skin nightly 5 pen 5   . empagliflozin (JARDIANCE) 25 MG tablet Take 25 mg by mouth daily 90 tablet 1   . empagliflozin (JARDIANCE) 10 MG tablet Take 10 mg by mouth daily     . Fexofenadine-Pseudoephedrine (ALLEGRA-D 24 HOUR PO) Take 1 tablet by mouth daily       No current facility-administered medications for this visit.      Health Maintenance   Topic Date Due   . Diabetic foot  exam  05/11/1984   . Diabetic retinal exam  05/11/1984   . HIV screen  05/11/1989   . DTaP/Tdap/Td vaccine (1 - Tdap) 05/11/1993   . Pneumococcal med risk (1 of 1 - PPSV23) 05/11/1993   . Flu vaccine (1) 04/19/2017   . Diabetic microalbuminuria test  04/14/2018   . A1C test (Diabetic or Prediabetic)  04/16/2018   . Lipid screen  04/16/2018      Social History     Social History   . Marital status: Single     Spouse name: N/A   . Number of children: N/A   . Years of education: N/A     Social History Main Topics   . Smoking status: Current Every Day Smoker     Packs/day: 1.50     Years: 30.00   . Smokeless tobacco: Current User     Types: Chew   . Alcohol use Yes      Comment: 1-3 beers a day   . Drug use: No   . Sexual activity: Not Asked     Other Topics Concern   . None     Social History Narrative   . None     Family History   Problem Relation Age of Onset   . Diabetes Mother    . Diabetes Father    . Heart Disease Father    . Diabetes Maternal Grandmother    . Heart Disease Paternal Grandmother    . Arrhythmia Paternal Grandmother      There is no problem list on file for this patient.       LABS:  Orders Only on 04/16/2017   Component Date Value Ref Range Status   . LDL Direct 04/16/2017 51  <161 mg/dL Final          PHYSICAL EXAM  BP (!) 142/90 (Site: Right Arm, Position: Sitting)   Pulse 96   Resp 16   Ht  (1.854 m)   Wt 258 lb (117 kg)   BMI 34.04 kg/m     BP Readings from Last 3 Encounters:   04/24/17 (!) 142/90   04/14/17 (!) 134/100       Wt Readings from Last 3 Encounters:   04/24/17 258 lb (117 kg)   04/14/17 260 lb (117.9 kg)        Physical Exam   Constitutional: He is oriented to person, place, and time. He appears well-developed and well-nourished. No distress.   HENT:   Head: Normocephalic and atraumatic.   Right Ear: External ear normal.   Left Ear: External ear normal.   Nose: Nose normal.   Mouth/Throat: Oropharynx is clear and moist. No oropharyngeal exudate.   Eyes: Pupils are equal,  round, and reactive to light. Conjunctivae and EOM are normal. Right eye exhibits no discharge. Left eye exhibits no discharge. No scleral icterus.   Neck: Normal range of  motion. Neck supple. No JVD present. No tracheal deviation present. No thyromegaly present.   Cardiovascular: Normal rate and regular rhythm.  Exam reveals no gallop and no friction rub.    No murmur heard.  Pulmonary/Chest: Effort normal and breath sounds normal. No stridor. No respiratory distress. He has no wheezes. He has no rales. He exhibits no tenderness.   Abdominal: Soft. Bowel sounds are normal. He exhibits no distension and no mass. There is no tenderness. There is no rebound and no guarding.   Musculoskeletal: Normal range of motion. He exhibits no edema, tenderness or deformity.   Lymphadenopathy:     He has no cervical adenopathy.   Neurological: He is alert and oriented to person, place, and time. He has normal reflexes. No cranial nerve deficit. He exhibits normal muscle tone. Coordination normal.   Skin: Skin is warm and dry. No rash noted. He is not diaphoretic. No erythema. No pallor.   Psychiatric: He has a normal mood and affect. His behavior is normal. Judgment and thought content normal.   Vitals reviewed.      ASSESSMENT/PLAN:  Barbara CowerJason was seen today for diabetes, hypertension and hyperlipidemia.    Diagnoses and all orders for this visit:    Type 2 diabetes mellitus without complication, with long-term current use of insulin (HCC)  -     metFORMIN (GLUCOPHAGE XR) 500 MG extended release tablet; Take 4 tablets by mouth Daily with supper  -     insulin detemir (LEVEMIR FLEXPEN) 100 UNIT/ML injection pen; Inject 100 Units into the skin nightly  -     empagliflozin (JARDIANCE) 25 MG tablet; Take 25 mg by mouth daily  Patient was in tolerance to immediate release metformin in the past.  He is willing to try extended release metformin I recommend he start with 500 mg with dinner try it for 4-5 days and then titrate up slowly to  taking a total dose of 2000 mg a day.  He did increase his Jardiance to 25 mg a day.  Continue his current Levemir at 80 units in the evening.  He'll check his morning sugar and I'll follow-up with him in 2-4 weeks to review his blood sugar record.  At that point assuming he is tolerating metformin at 2 2000 mg a day with then consider adding either a DPP for a GLP-1.  Try to get off of his insulin.   Hyperlipidemia, unspecified hyperlipidemia type  -     rosuvastatin (CRESTOR) 20 MG tablet; Take 1 tablet by mouth daily  His LDL cholesterol was 50 1O continue Crestor one a day.  #90 with a refill a recheck his cholesterol in 6 months.  Proteinuria, unspecified type  -     lisinopril (PRINIVIL;ZESTRIL) 5 MG tablet; Take 1 tablet by mouth daily  Because of his proteinuria and hypertension I put him on lisinopril 5 mg a day we'll recheck his blood pressure in about 2-4 weeks.  Recheck his albumin creatinine ratio in 3 months.  Essential hypertension  -     lisinopril (PRINIVIL;ZESTRIL) 5 MG tablet; Take 1 tablet by mouth daily    I did give him Dr. Rachelle HoraMoss, Dr. Jordan LikesSchmitz, and Dr. Lynnea FerrierSolomon for  psychiatry recommendation    No Follow-up on file.  Reviewed and/or ordered clinical lab results Yes  Reviewed and/or ordered radiology tests No  Reviewed and/or ordered other diagnostic tests No  Discussed test results with performing physician No  Independently reviewed image, tracing, or specimen No  Made a  decision to obtain old records No  Reviewed and summarized old records No      Soren Lazarz was counseled regarding symptoms of current diagnosis, course and complications of disease if inadequately treated, side effects of medications, diagnosis, treatment options, and prognosis, risks, benefits, complications, and alternatives of treatment, labs, imaging and other studies and treatment targets and goals.  He understands instructions and counseling.      This dictation was performed with a verbal recognition program (DRAGON) and  it was checked for errors. It is possible that there are still dictated errors within this office note. If so, please bring any errors to my attention for an addendum. All efforts were made to ensure that this office note is accurate.

## 2017-04-24 NOTE — Telephone Encounter (Signed)
I placed the starter month in there but I am not sure of the directions.

## 2017-04-30 NOTE — Telephone Encounter (Signed)
Error

## 2017-05-07 ENCOUNTER — Encounter: Payer: BLUE CROSS/BLUE SHIELD | Attending: Family Medicine

## 2017-05-08 ENCOUNTER — Encounter: Attending: Family | Primary: Family

## 2017-05-27 ENCOUNTER — Encounter

## 2017-05-27 NOTE — Telephone Encounter (Signed)
Pt is coming in to see Adam Moon on 06-02-17

## 2017-05-27 NOTE — Telephone Encounter (Signed)
Already informed pt

## 2017-05-27 NOTE — Telephone Encounter (Signed)
Dog chewed up the package of chantix can we send in another script    Barbara CowerJason [phone]

## 2017-05-27 NOTE — Telephone Encounter (Signed)
He get the refill at that time.

## 2017-05-27 NOTE — Telephone Encounter (Signed)
He  is due for follow-up of his hypertension.  I will refill his Chantix when he comes in for that appointment.

## 2017-06-02 ENCOUNTER — Encounter

## 2017-06-02 ENCOUNTER — Ambulatory Visit: Admit: 2017-06-02 | Discharge: 2017-06-02 | Payer: BLUE CROSS/BLUE SHIELD | Attending: Family

## 2017-06-02 DIAGNOSIS — E119 Type 2 diabetes mellitus without complications: Secondary | ICD-10-CM

## 2017-06-02 LAB — POCT MICROALBUMIN
Creatinine Ur POCT: 300
Microalb, Ur: 80

## 2017-06-02 MED ORDER — VARENICLINE TARTRATE 0.5 MG X 11 & 1 MG X 42 PO MISC
0.5 | ORAL | 0 refills | Status: AC
Start: 2017-06-02 — End: ?

## 2017-06-02 MED ORDER — VARENICLINE TARTRATE 1 MG PO TABS
1 MG | ORAL_TABLET | Freq: Two times a day (BID) | ORAL | 2 refills | Status: AC
Start: 2017-06-02 — End: ?

## 2017-06-02 MED ORDER — EMPAGLIFLOZIN 25 MG PO TABS
25 | ORAL_TABLET | Freq: Every day | ORAL | 0 refills | Status: AC
Start: 2017-06-02 — End: ?

## 2017-06-02 MED ORDER — INSULIN DETEMIR 100 UNIT/ML SC SOPN
100 | PEN_INJECTOR | Freq: Every evening | SUBCUTANEOUS | 2 refills | Status: AC
Start: 2017-06-02 — End: ?

## 2017-06-02 NOTE — Progress Notes (Signed)
06/02/2017    Chief Complaint   Patient presents with   . Diabetes     Pt needs refills on all meds #90..pt had labs done 04/24/17.Marland Kitchenpt also requesting refill on chantix..pt states was not able to take metformin due to very upset stomach..pt's blood sugar averages anywhere from 91-162..   . Hypertension       HPI  Adam Moon is here for follow up with DM, HTN and refill of Chantix. He states that was unable to tolerate Metformin extended release due to gastric symptoms. He reports blood sugars 90-130's. He had one episode of elevated blood sugar 160's after eating higher carb diet. He states that took Chantix for one week then dog got a hold of packet and was not able to complete. He states that would like to retry Chantix because felt was working. He denies chest pain , shortness of breath, dizziness or nausea. He does have cough, nonproductive.     Review of Systems   Constitutional: Negative for fatigue and fever.   HENT: Negative for trouble swallowing.    Eyes: Negative for visual disturbance.   Respiratory: Negative for cough, chest tightness and shortness of breath.    Cardiovascular: Negative for chest pain, palpitations and leg swelling.   Gastrointestinal: Negative for abdominal pain, constipation, diarrhea and nausea.   Endocrine: Negative for polydipsia, polyphagia and polyuria.   Genitourinary: Negative for difficulty urinating, frequency and urgency.   Neurological: Negative for dizziness, weakness, light-headedness and headaches.       No Known Allergies  Prior to Visit Medications    Medication Sig Taking? Authorizing Provider   empagliflozin (JARDIANCE) 25 MG tablet Take 25 mg by mouth daily Yes Irwin Brakeman, APRN - CNP   insulin detemir (LEVEMIR FLEXPEN) 100 UNIT/ML injection pen Inject 100 Units into the skin nightly Yes Irwin Brakeman, APRN - CNP   varenicline (CHANTIX STARTING MONTH PAK) 0.5 MG X 11 & 1 MG X 42 tablet Take by mouth. Yes Irwin Brakeman, APRN - CNP   varenicline (CHANTIX CONTINUING  MONTH PAK) 1 MG tablet Take 1 tablet by mouth 2 times daily Yes Irwin Brakeman, APRN - CNP   esomeprazole Magnesium (NEXIUM) 40 MG PACK Take 40 mg by mouth daily Yes Historical Provider, MD   lisinopril (PRINIVIL;ZESTRIL) 5 MG tablet Take 1 tablet by mouth daily Yes Warrick Parisian, MD   rosuvastatin (CRESTOR) 20 MG tablet Take 1 tablet by mouth daily Yes Warrick Parisian, MD   Fexofenadine-Pseudoephedrine (ALLEGRA-D 24 HOUR PO) Take 1 tablet by mouth daily Yes Historical Provider, MD     Current Outpatient Prescriptions   Medication Sig Dispense Refill   . empagliflozin (JARDIANCE) 25 MG tablet Take 25 mg by mouth daily 90 tablet 0   . insulin detemir (LEVEMIR FLEXPEN) 100 UNIT/ML injection pen Inject 100 Units into the skin nightly 5 pen 2   . varenicline (CHANTIX STARTING MONTH PAK) 0.5 MG X 11 & 1 MG X 42 tablet Take by mouth. 1 each 0   . varenicline (CHANTIX CONTINUING MONTH PAK) 1 MG tablet Take 1 tablet by mouth 2 times daily 60 tablet 2   . esomeprazole Magnesium (NEXIUM) 40 MG PACK Take 40 mg by mouth daily     . lisinopril (PRINIVIL;ZESTRIL) 5 MG tablet Take 1 tablet by mouth daily 90 tablet 1   . rosuvastatin (CRESTOR) 20 MG tablet Take 1 tablet by mouth daily 90 tablet 1   . Fexofenadine-Pseudoephedrine (ALLEGRA-D 24 HOUR PO) Take 1 tablet by  mouth daily       No current facility-administered medications for this visit.      Health Maintenance   Topic Date Due   . Diabetic foot exam  05/11/1984   . HIV screen  05/11/1989   . DTaP/Tdap/Td vaccine (1 - Tdap) 05/11/1993   . Pneumococcal med risk (1 of 1 - PPSV23) 05/11/1993   . Flu vaccine (1) 04/19/2017   . Diabetic microalbuminuria test  04/14/2018   . A1C test (Diabetic or Prediabetic)  04/16/2018   . Lipid screen  04/16/2018   . Potassium monitoring  04/16/2018   . Creatinine monitoring  04/16/2018   . Diabetic retinal exam  05/01/2018      Social History     Social History   . Marital status: Single     Spouse name: N/A   . Number of children: N/A    . Years of education: N/A     Social History Main Topics   . Smoking status: Current Every Day Smoker     Packs/day: 1.50     Years: 30.00     Types: Cigarettes   . Smokeless tobacco: Current User     Types: Chew   . Alcohol use Yes      Comment: 1-3 beers a day   . Drug use: No   . Sexual activity: Not Asked     Other Topics Concern   . None     Social History Narrative   . None     Family History   Problem Relation Age of Onset   . Diabetes Mother    . Diabetes Father    . Heart Disease Father    . Diabetes Maternal Grandmother    . Heart Disease Paternal Grandmother    . Arrhythmia Paternal Grandmother      Patient Active Problem List   Diagnosis   . Essential hypertension   . Type 2 diabetes mellitus without complication, with long-term current use of insulin (HCC)   . Class 1 obesity due to excess calories with serious comorbidity and body mass index (BMI) of 32.0 to 32.9 in adult   . Tobacco abuse        LABS:  Orders Only on 04/16/2017   Component Date Value Ref Range Status   . LDL Direct 04/16/2017 51  <161 mg/dL Final          PHYSICAL EXAM   BP 130/80 (Site: Right Upper Arm, Position: Sitting)   Pulse 114   Temp 97.8 F (36.6 C) (Oral)   Resp 16   Ht  (1.905 m)   Wt 256 lb (116.1 kg)   BMI 32.00 kg/m     BP Readings from Last 3 Encounters:   06/02/17 130/80   04/24/17 (!) 142/90   04/14/17 (!) 134/100     Wt Readings from Last 3 Encounters:   06/02/17 256 lb (116.1 kg)   04/24/17 258 lb (117 kg)   04/14/17 260 lb (117.9 kg)        Physical Exam   Constitutional: He is oriented to person, place, and time. He appears well-developed and well-nourished.   HENT:   Head: Normocephalic and atraumatic.   Eyes: Pupils are equal, round, and reactive to light. Conjunctivae and EOM are normal.   Neck: Normal range of motion. Neck supple.   Cardiovascular: Normal rate, regular rhythm, S1 normal, S2 normal, normal heart sounds and intact distal pulses.    No murmur heard.  Pulses:  Dorsalis pedis  pulses are 3+ on the right side, and 3+ on the left side.        Posterior tibial pulses are 3+ on the right side, and 3+ on the left side.   Pulmonary/Chest: Effort normal and breath sounds normal. No respiratory distress. He has no wheezes. He has no rales. He exhibits no tenderness.   Abdominal: Soft. Normal appearance and bowel sounds are normal. There is no tenderness.   Musculoskeletal: Normal range of motion. He exhibits no edema.   Feet:   Right Foot:   Protective Sensation: 10 sites tested. 10 sites sensed.   Skin Integrity: Negative for ulcer, blister, skin breakdown, callus or dry skin.   Left Foot:   Protective Sensation: 10 sites tested. 10 sites sensed.   Skin Integrity: Negative for ulcer, blister, skin breakdown, callus or dry skin.   Neurological: He is alert and oriented to person, place, and time. He has normal reflexes.   Skin: Skin is warm, dry and intact.   Psychiatric: He has a normal mood and affect. His behavior is normal.   Nursing note and vitals reviewed.      Visual inspection:   Deformity/amputation: absent  Skin lesions/pre-ulcerative calluses: absent  Edema: right- negative, left- negative    Sensory exam:  Monofilament sensation: normal  (minimum of 5 random plantar locations tested, avoidingcallused areas - > 1 area with absence of sensation is + for neuropathy)    Plus at least one of the following:  Pulses:normal,   Pinprick: Intact  Proprioception: Absent  Vibration (128 Hz): N/A    ASSESSMENT/PLAN:  Kenson was seen today for diabetes and hypertension.    Diagnoses and all orders for this visit:    Type 2 diabetes mellitus without complication, with long-term current use of insulin (HCC)  -     empagliflozin (JARDIANCE) 25 MG tablet; Take 25 mg by mouth daily  -     insulin detemir (LEVEMIR FLEXPEN) 100 UNIT/ML injection pen; Inject 100 Units into the skin nightly  -     Hemoglobin A1C; Future  -     HM DIABETES FOOT EXAM  -     POCT microalbumin  -     Comprehensive Metabolic  Panel, Fasting; Future  -     Hemoglobin A1C; Future  DM Eye exam 05/01/17, Repeat in one year  HgAIC drawn today and will readjust medications as needed. He reports that forgets dose of Levemir 1-2 times per week and I have advised for him to take in am to eliminate noncompliance.     Essential hypertension        -      Lisinopril 5 mg 1 tab daily   -     Comprehensive Metabolic Panel, Fasting; Future  Improved with Lisinopril    Class 1 obesity due to excess calories with serious comorbidity and body mass index (BMI) of 32.0 to 32.9 in adult  Lifestyle modifications discussed; increase protein and exercise with decrease to calorie consumption and fat      Ready to quit smoking  -     varenicline (CHANTIX STARTING MONTH PAK) 0.5 MG X 11 & 1 MG X 42 tablet; Take by mouth.  -     varenicline (CHANTIX CONTINUING MONTH PAK) 1 MG tablet; Take 1 tablet by mouth 2 times daily  Reinforced variations of approaches to quit dates  Flu vaccine today  Reinforced to take with large meal   I have spent 5 min  counseling on smoking cessation and proper dosing of CHantix    Tobacco abuse  -     varenicline (CHANTIX STARTING MONTH PAK) 0.5 MG X 11 & 1 MG X 42 tablet; Take by mouth.  -     varenicline (CHANTIX CONTINUING MONTH PAK) 1 MG tablet; Take 1 tablet by mouth 2 times daily  Reinforced variations of approaches to quit dates  Flu vaccine today; Recommend PPSV 23  Reinforced to take with large meal   I have spent 5 min counseling on smoking cessation and proper dosing of CHantix    Need for influenza vaccination  -     INFLUENZA, QUADV, 3 YRS AND OLDER, IM, PF, PREFILL SYR OR SDV, 0.5ML (FLUZONE QUADV, PF)    Hyperlipidemia, unspecified hyperlipidemia type  -     Lipid, Fasting; Future  Crestor 20 mg 1 tab daily        Return in about 3 months (around 09/02/2017) for DM TYPE II, HTN, HYPERLIPIDEMIA.  Reviewed and/or ordered clinical lab results Yes  Reviewed and/or ordered radiology tests No  Reviewed and/or ordered other  diagnostic testsYes  Discussed test results with performing physician No  Independently reviewed image, tracing, or specimen Yes  Made a decision to obtain old records No  Reviewed and summarized old records Yes      Adam Moon was counseled regarding symptoms of current diagnosis, course and complications of disease if inadequatelytreated, side effects of medications, diagnosis, treatment options, and prognosis, risks, benefits, complications, and alternatives of treatment, labs, imaging and other studies and treatment targets and goals.  Heunderstands instructions and counseling.    These diagnosis were discussed and reviewed with the patient including the advantages ofdrug therapy. He was counseled at this visit on the following: diabetes complication prevention and foot care

## 2017-06-03 LAB — HEMOGLOBIN A1C
Hemoglobin A1C: 6.9 %
eAG: 151.3 mg/dL

## 2017-09-16 NOTE — Telephone Encounter (Signed)
He has tried and failed the Nicoderm patches-OTC

## 2017-09-16 NOTE — Telephone Encounter (Signed)
lmonvm to call and make appt

## 2017-09-16 NOTE — Telephone Encounter (Signed)
PA for patient's Chantix was denied.    Per health plan criteria for Chantix, coverage is denied. Chantix can be approved if: the patient has a history of failure, contraindication, or intolerance to bupropion (generic Zyban).

## 2017-09-16 NOTE — Telephone Encounter (Signed)
I am working on a Prior Authorization for patient's Chantix 1 mg tablets. In order to complete the PA, there is one question remaining that I do not have the answer to:    Select the medications the member has a history of failure, contraindication, or intolerance to:  Bupropion (Zyban)  Habitrol-OTC  Nicoderm CQ-OTC  Nicorette gum-OTC  Nicorette lozenge-OTC  Nicorette mini-lozenge-OTC  Thrive gum-OTC  Thrive lozenge-OTC    Has patient tried any OTC medications for tobacco cessation? I can't find any information regarding this in patient's office notes. All I see is that he had tried Chantix in the past.

## 2017-09-16 NOTE — Telephone Encounter (Signed)
The patient was last seen in October have him make an appointment to discuss changing the medication to bupropion

## 2017-11-22 ENCOUNTER — Encounter

## 2017-11-24 NOTE — Telephone Encounter (Signed)
LOV 06/02/17

## 2017-11-24 NOTE — Telephone Encounter (Signed)
lmonvm

## 2018-06-10 IMAGING — CT CT ABD/PEL WO (STONE PROTOCOL)
2 of 4 series · 17 of 46 positions shown, 19 images · non-contrast
Comparison: None

HISTORY: Right flank pain
TECHNIQUE: Scans of the abdomen and pelvis are performed in the transaxial plane at 3 mm increments without oral or IV contrast using renal stone protocol.

Total radiation dose to patient is CTDIvol 18.31 mGy and DLP 958.09 mGy-cm.

[Series 2: soft tissue · axial · 0.88mm/px · z∈[-510,-57]mm · 14 of 165 slices shown, 16 images]
[im 7/165  soft-tissue]
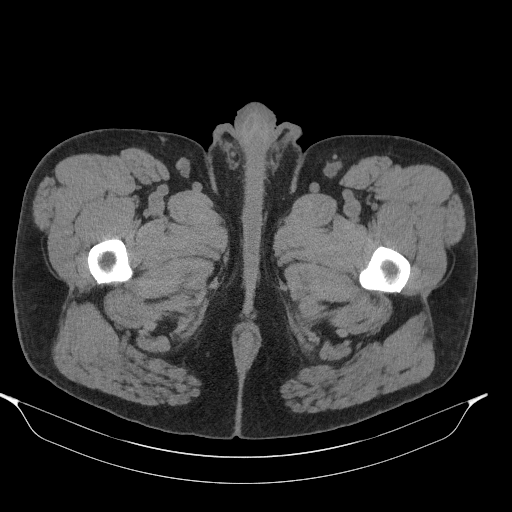
[im 7/165  bone]
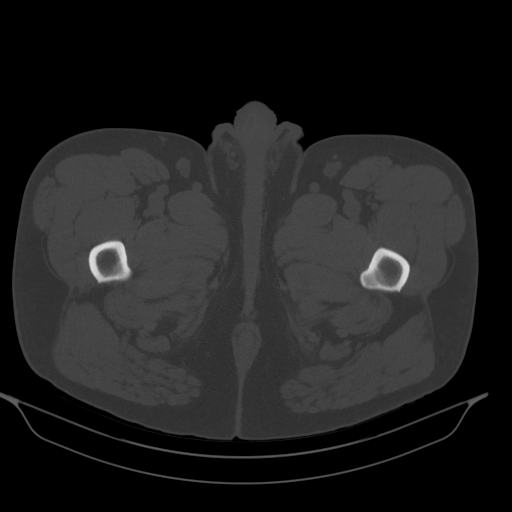
[im 19/165  soft-tissue]
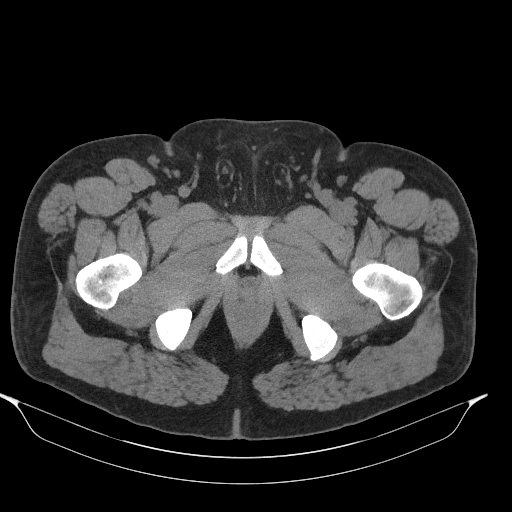
[im 31/165  soft-tissue]
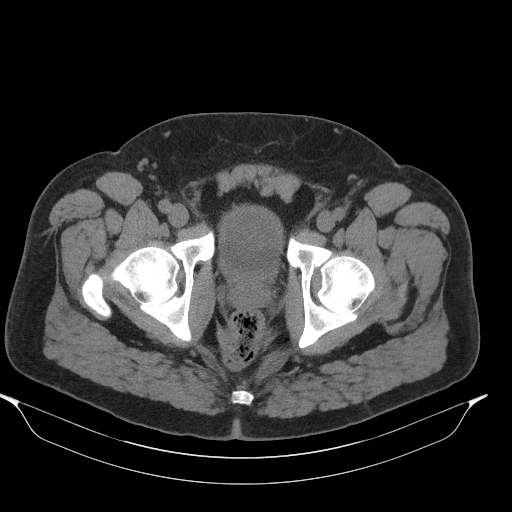
[im 43/165  soft-tissue]
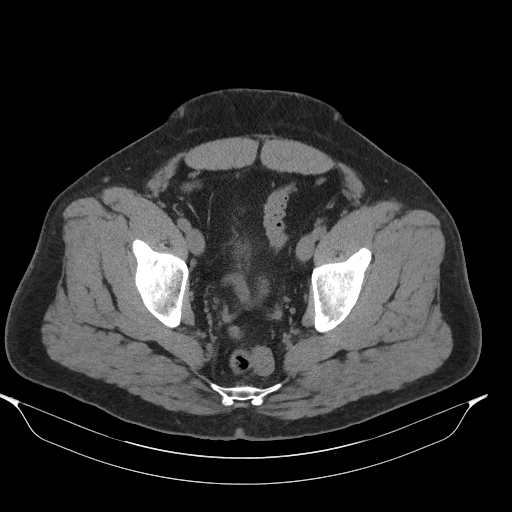
[im 55/165  soft-tissue]
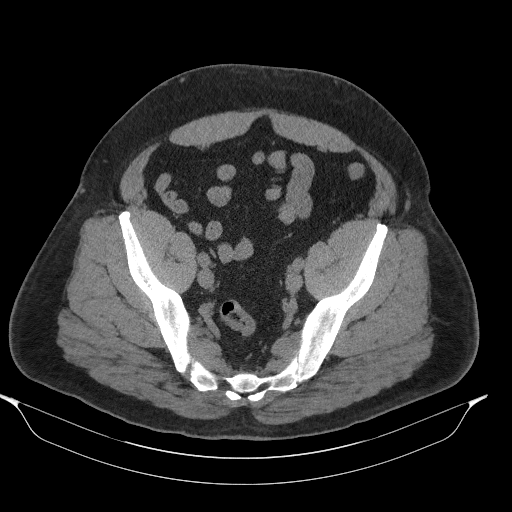
[im 67/165  soft-tissue]
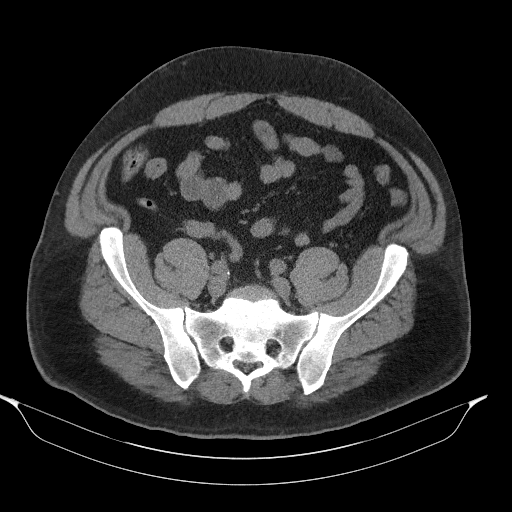
[im 79/165  soft-tissue]
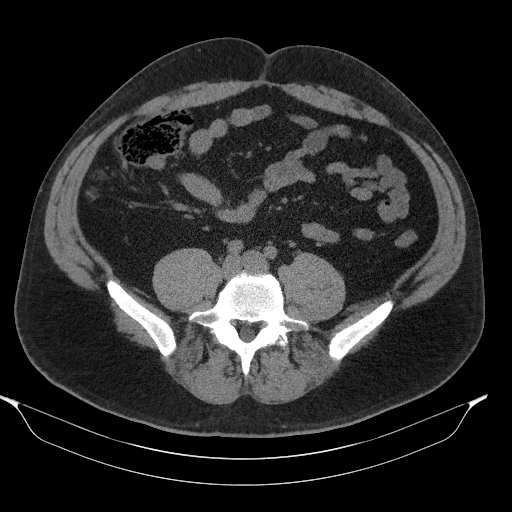
[im 86/165  soft-tissue]
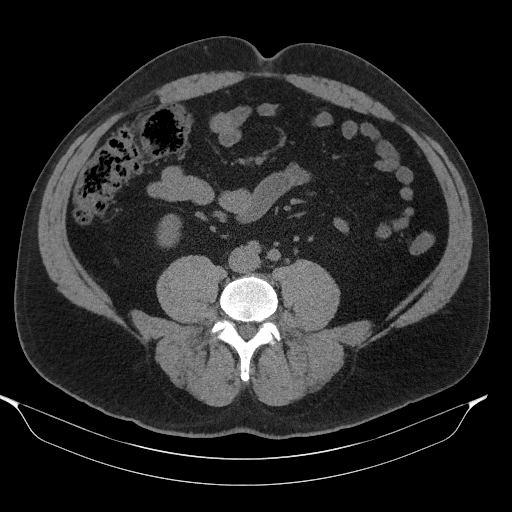
[im 98/165  soft-tissue]
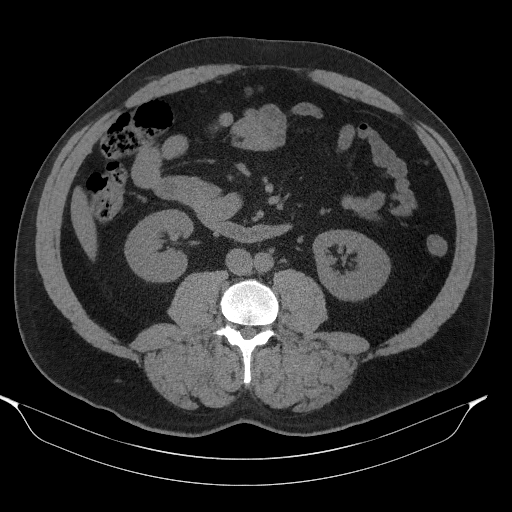
[im 98/165  bone]
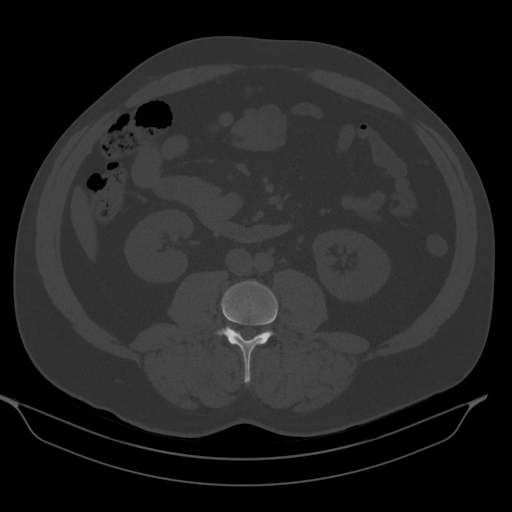
[im 110/165  soft-tissue]
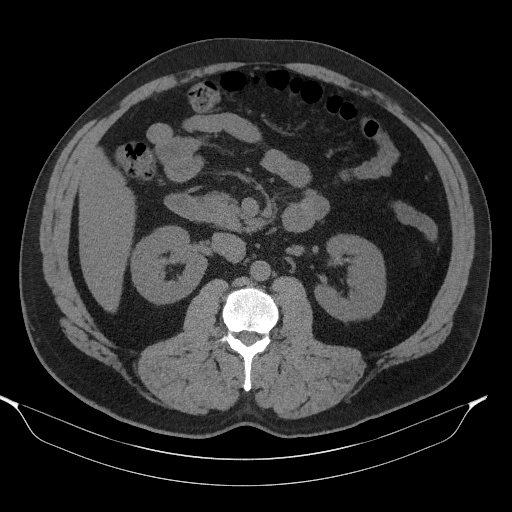
[im 122/165  soft-tissue]
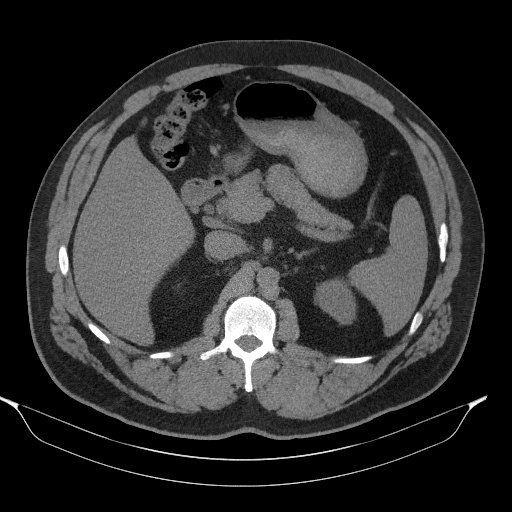
[im 134/165  soft-tissue]
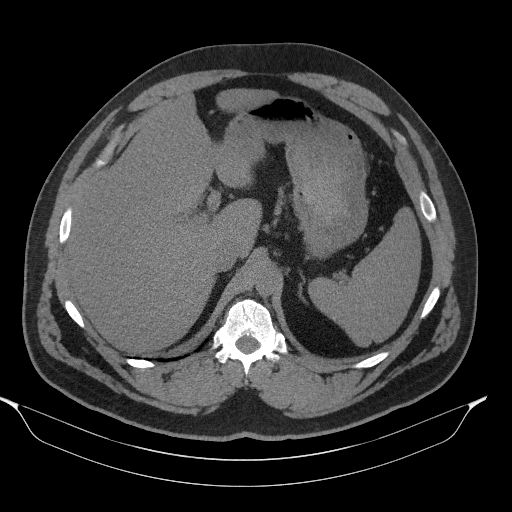
[im 146/165  soft-tissue]
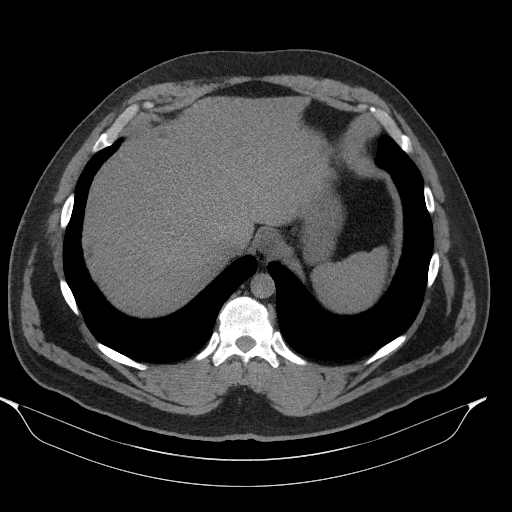
[im 158/165  soft-tissue]
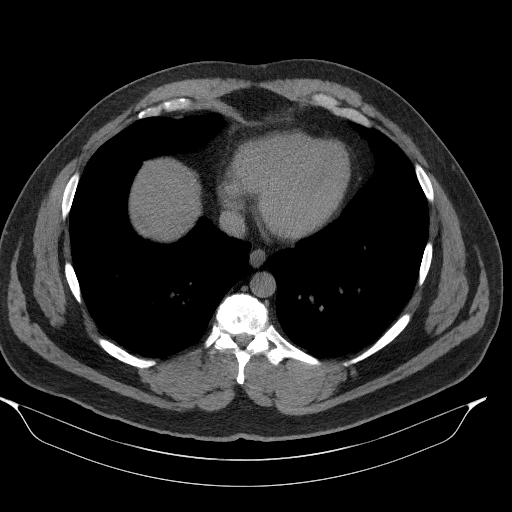

[Series 4: coronal · coronal · 0.87mm/px · 3 of 109 slices shown]
[im 37/109  soft-tissue]
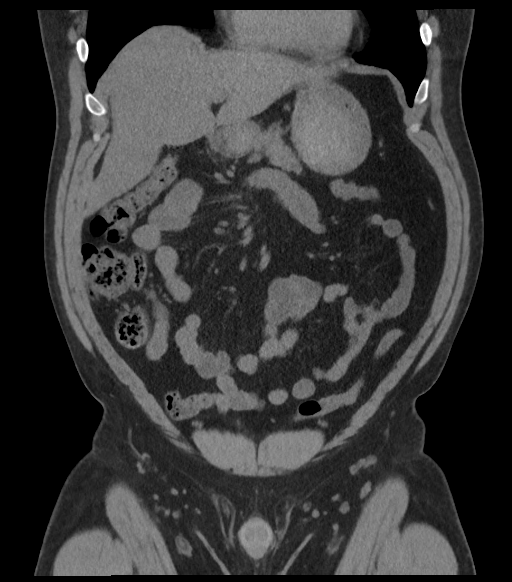
[im 49/109  soft-tissue]
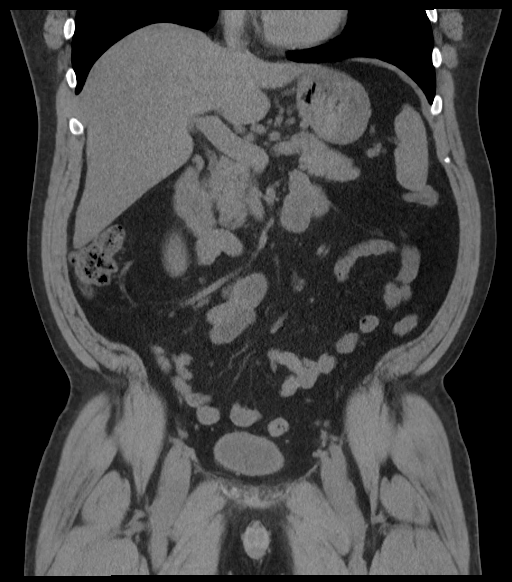
[im 61/109  soft-tissue]
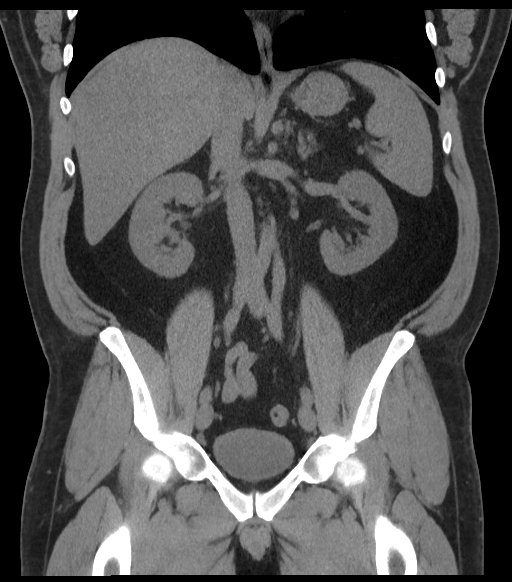

[17 of 46 positions shown; findings below may reference images not displayed]

FINDINGS: CT ABDOMEN: Lung bases are clear. Caudal margin of heart is normal. The liver, biliary system status post cholecystectomy, spleen, pancreas, adrenal glands and kidneys are normal. The periaortic and pericaval spaces are normal and no intraperitoneal findings are seen other than for a 1 cm diameter fat-containing umbilical hernia.

CT PELVIS: The right colon including terminal ileum and appendix are normal. The remainder of the colon is normal with no significant diverticular disease or evidence of colitis. The distal right and left ureters and bladder are normal. No inguinal findings are seen. Coronal and sagittal reconstructions show no additional findings. No bone abnormalities are present.
IMPRESSION: CT abdomen and pelvis using stone protocol is negative for renal collecting system, ureteral or bladder stone; no significant urinary tract findings are present; small fat-containing umbilical hernia is incidentally seen.

## 2020-08-22 IMAGING — MR MRI HIP RT WO CONTRAST
5 series · 40 of 40 positions shown · non-contrast
Comparison: None

HISTORY: Lumbago with sciatica
TECHNIQUE: Multiplanar and multisequence MR imaging of the right hip was performed without the administration of intravenous contrast.

[Series 2: t1_cor · coronal · right · 4.0mm · 0.66mm/px · 7 of 24 slices shown]
[im 1/24]
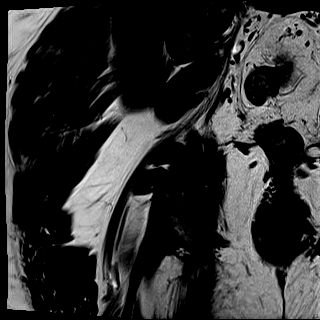
[im 4/24]
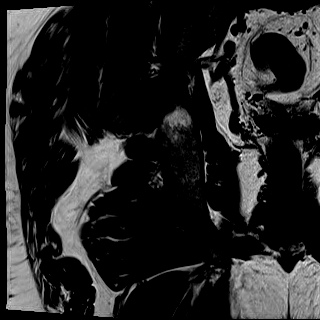
[im 8/24]
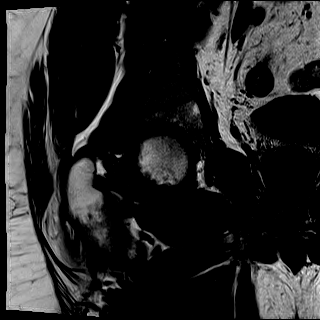
[im 12/24]
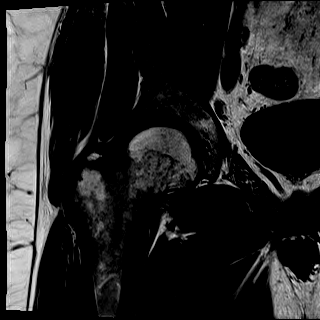
[im 16/24]
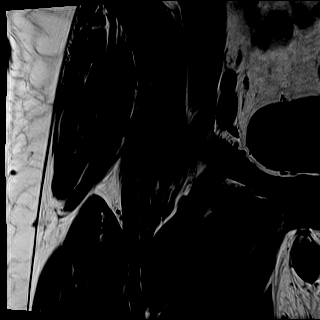
[im 20/24]
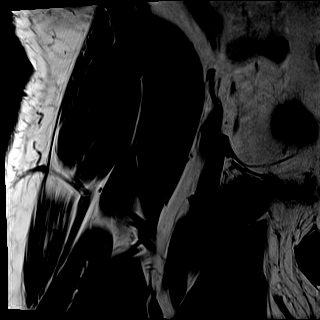
[im 24/24]
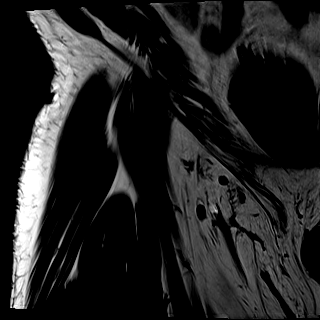

[Series 3: t2_cor_fs · coronal · right · 4.0mm · 0.66mm/px · 6 of 24 slices shown]
[im 1/24]
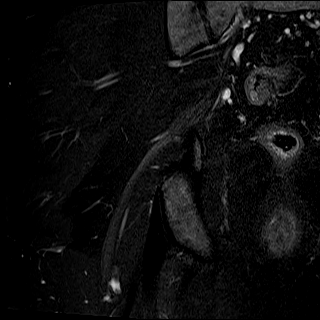
[im 5/24]
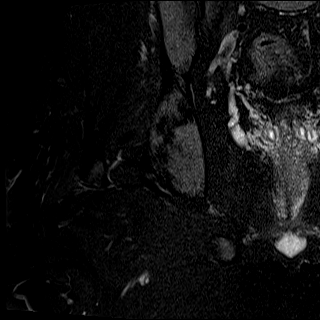
[im 10/24]
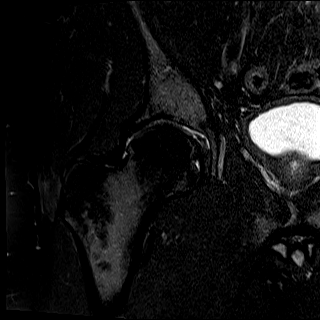
[im 14/24]
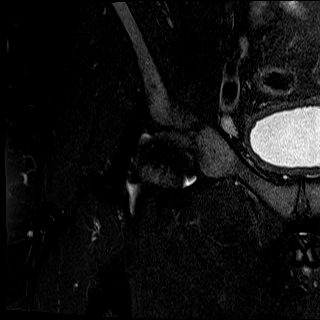
[im 19/24]
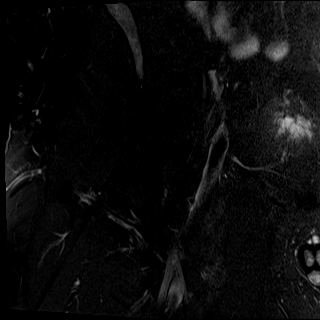
[im 24/24]
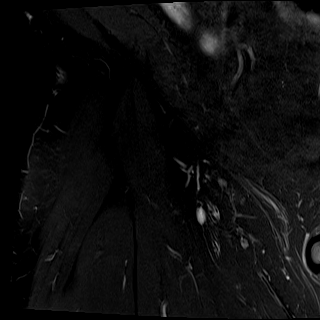

[Series 4: t2_axial_fs · axial · right · 4.0mm · 0.62mm/px · z∈[-61,+134]mm · 10 of 40 slices shown]
[im 1/40]
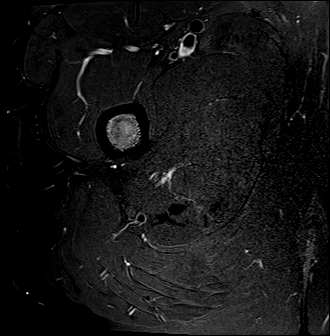
[im 5/40]
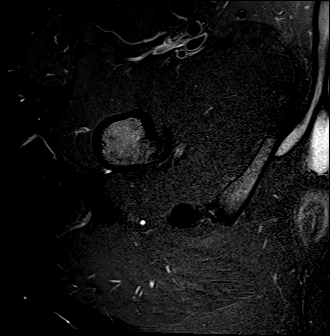
[im 9/40]
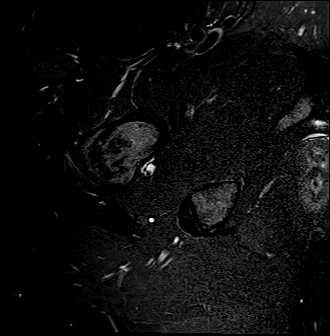
[im 14/40]
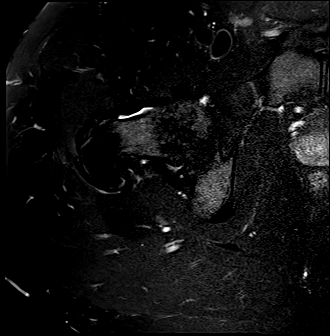
[im 18/40]
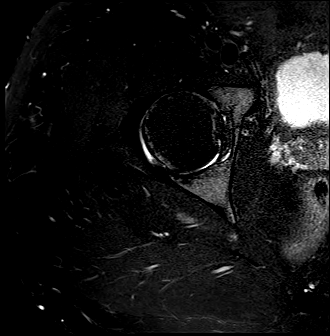
[im 22/40]
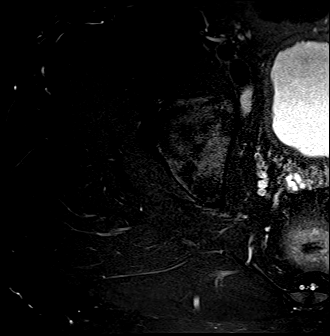
[im 27/40]
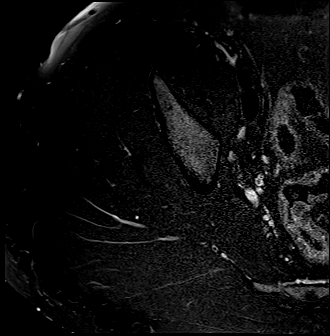
[im 31/40]
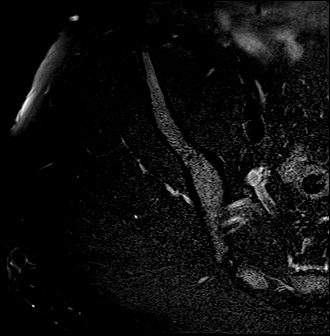
[im 35/40]
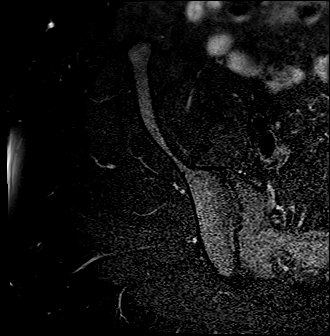
[im 40/40]
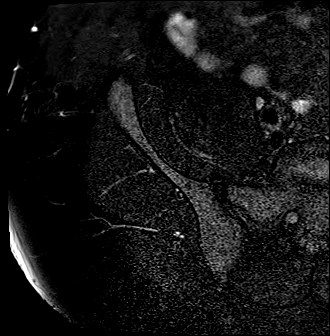

[Series 5: t1_axial · axial · right · 4.0mm · 0.69mm/px · z∈[-61,+134]mm · 10 of 40 slices shown]
[im 1/40]
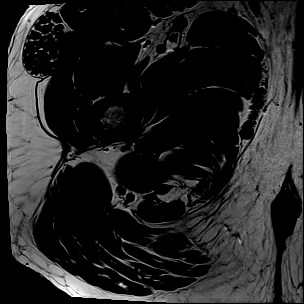
[im 5/40]
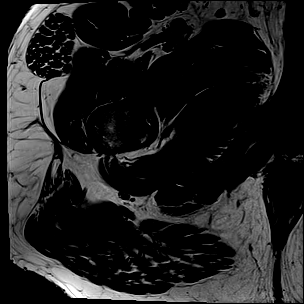
[im 9/40]
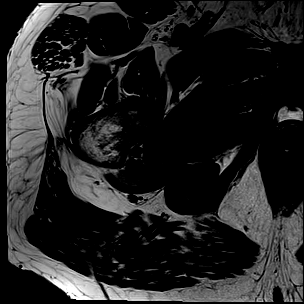
[im 14/40]
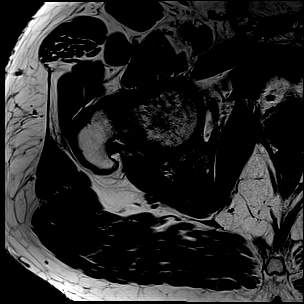
[im 18/40]
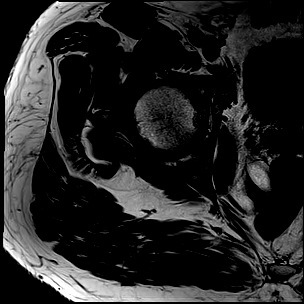
[im 22/40]
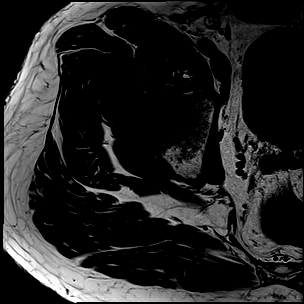
[im 27/40]
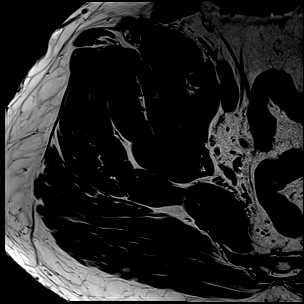
[im 31/40]
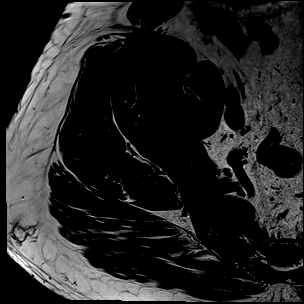
[im 35/40]
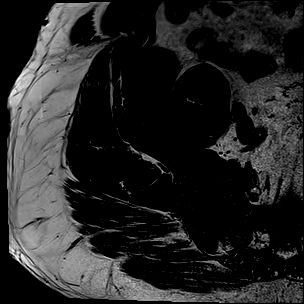
[im 40/40]
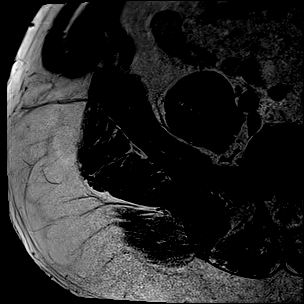

[Series 6: t2_sag_fs · sagittal · right · 4.0mm · 0.60mm/px · 7 of 26 slices shown]
[im 1/26]
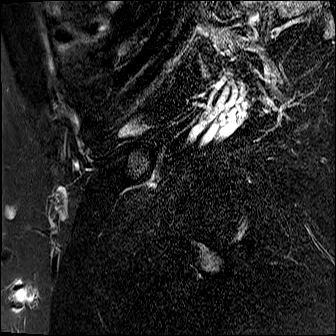
[im 5/26]
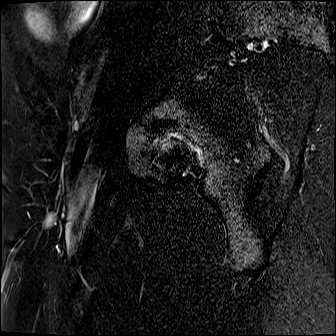
[im 9/26]
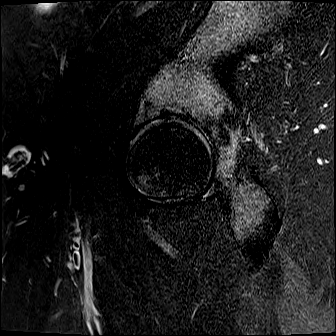
[im 13/26]
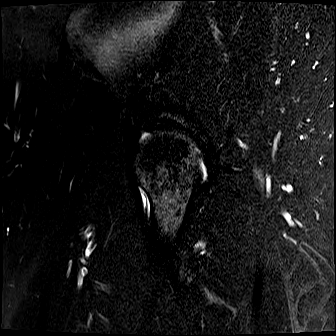
[im 17/26]
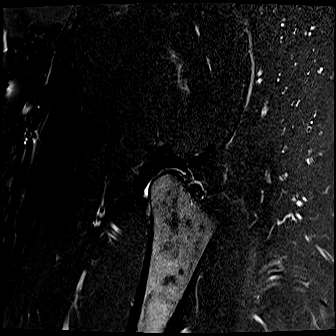
[im 21/26]
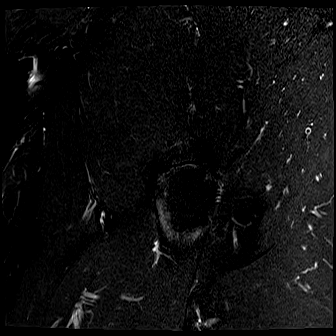
[im 26/26]
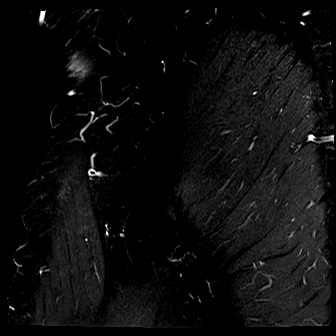

[40 of 40 positions shown; findings below may reference images not displayed]

FINDINGS: Labrum is intact.

Gluteus minimus and medius tendons are intact and unremarkable. No trochanteric bursitis.

Rectus femoris and iliopsoas tendons demonstrate nothing unusual.

Common hamstring tendon normal.

The articular cartilage of the femoral head and acetabulum is intact. There is no hip joint effusion. 

There is no prominence of the femoral head and neck junction to suggest femoroacetabular impingement. No evidence of ischiofemoral impingement.

Osseous structures maintain normal signal intensity. No fractures or destructive lesions.

Signal from muscle normal. Muscle mass normal. No cystic or solid masses. No adenopathy.

No abnormalities are identified within the visualized visceral pelvis.
IMPRESSION: Unremarkable right hip MRI examination.

## 2020-08-22 IMAGING — MR MRI TSPINE WO CONTRAST
5 series · 41 of 48 positions shown · non-contrast
Comparison: None

Images Obtained from [HOSPITAL] Imaging
INDICATION: Mid and low back pain with right-sided radiculopathy and right hip pain.
TECHNIQUE: MRI lumbar spine without contrast. Sagittal multisequence, axial T2 and coronal T1-weighted images of the thoracic spine were performed without intravenous contrast.

[Series 6: ii_aaspine_tspine_mpr_cor · coronal · 1.7mm · 1.67mm/px · 17 of 80 slices shown]
[im 1/80]
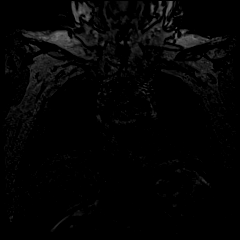
[im 5/80]
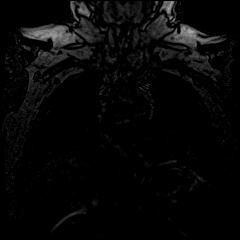
[im 10/80]
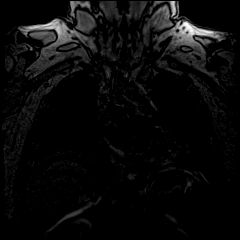
[im 15/80]
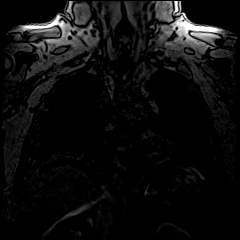
[im 20/80]
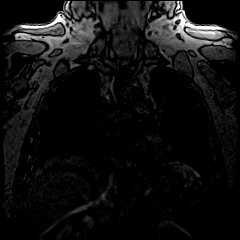
[im 25/80]
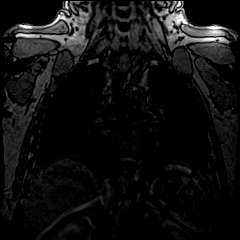
[im 30/80]
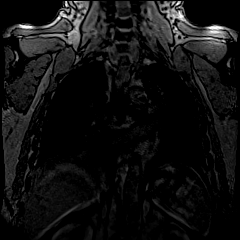
[im 35/80]
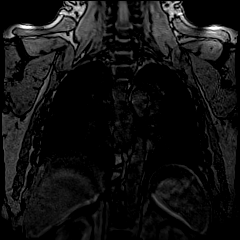
[im 40/80]
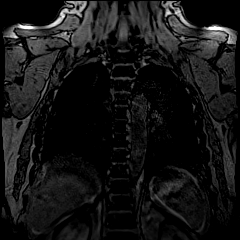
[im 45/80]
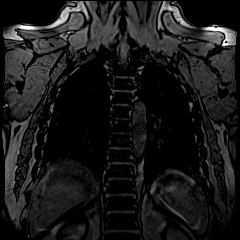
[im 50/80]
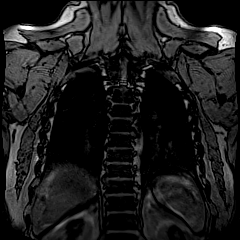
[im 55/80]
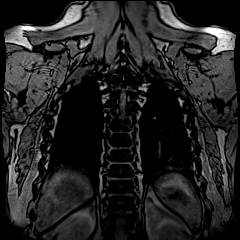
[im 60/80]
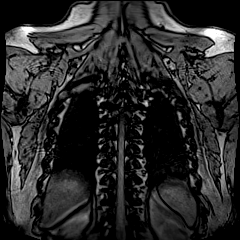
[im 65/80]
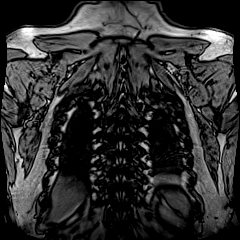
[im 70/80]
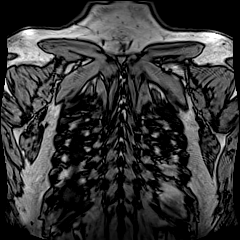
[im 75/80]
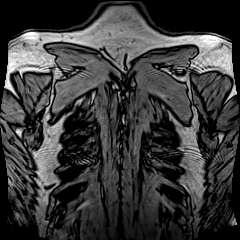
[im 80/80]
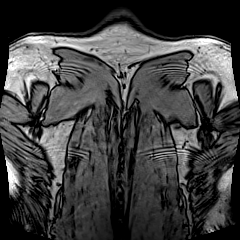

[Series 18: t2_tse_sag · sagittal · 3.0mm · 0.89mm/px · 5 of 20 slices shown]
[im 1/20]
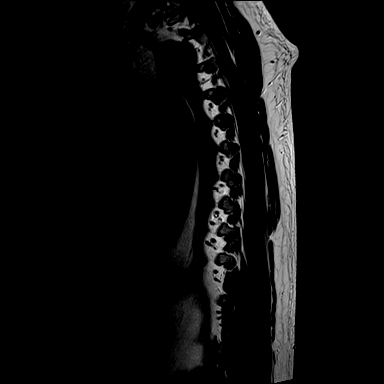
[im 5/20]
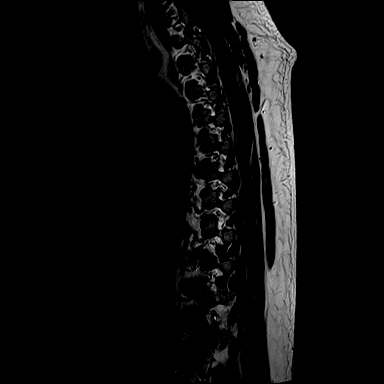
[im 10/20]
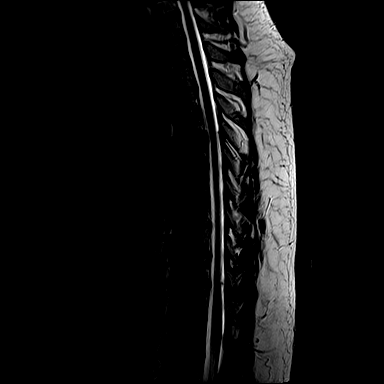
[im 15/20]
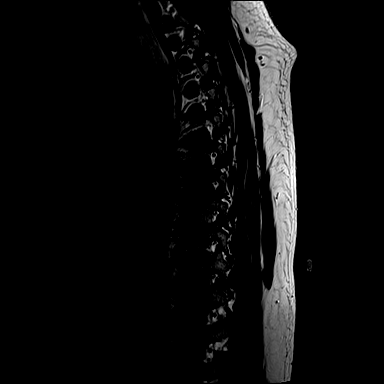
[im 20/20]
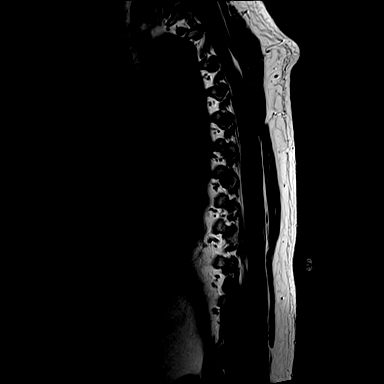

[Series 19: t1_sag · sagittal · 3.0mm · 0.89mm/px · 5 of 20 slices shown]
[im 1/20]
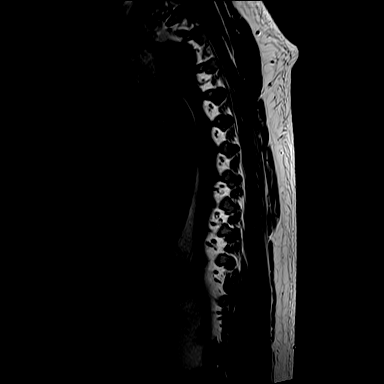
[im 5/20]
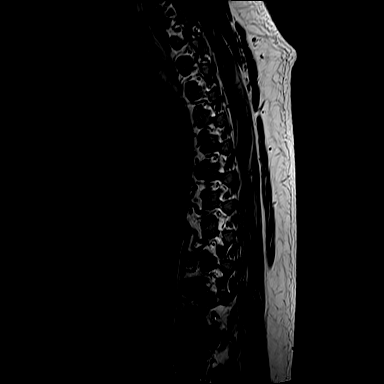
[im 10/20]
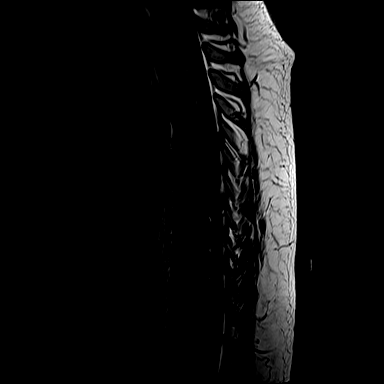
[im 15/20]
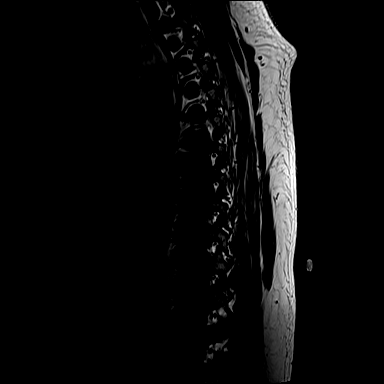
[im 20/20]
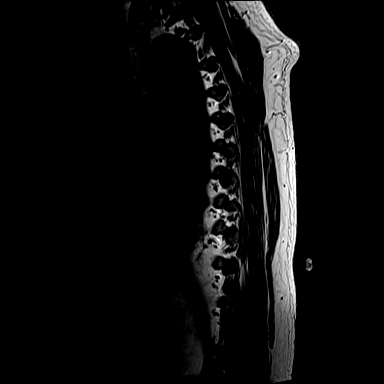

[Series 20: ir_sag · sagittal · 3.0mm · 0.66mm/px · 5 of 20 slices shown]
[im 1/20]
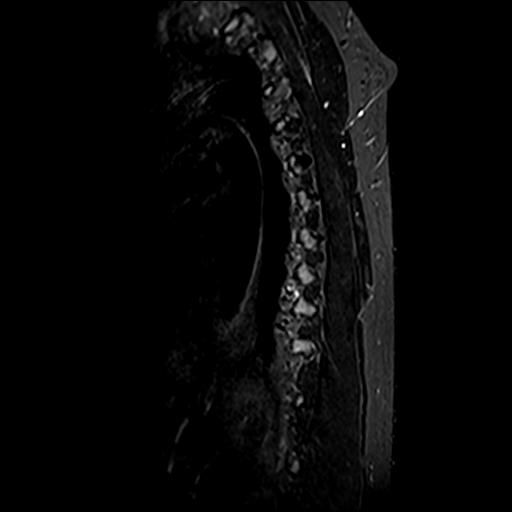
[im 5/20]
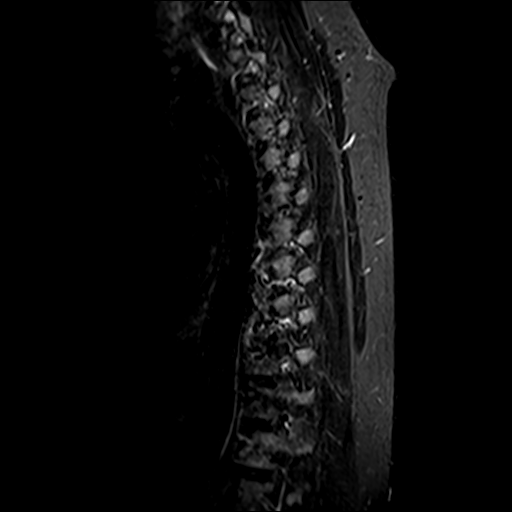
[im 10/20]
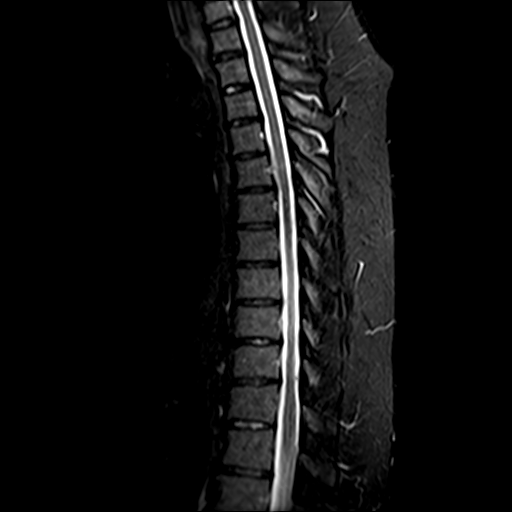
[im 15/20]
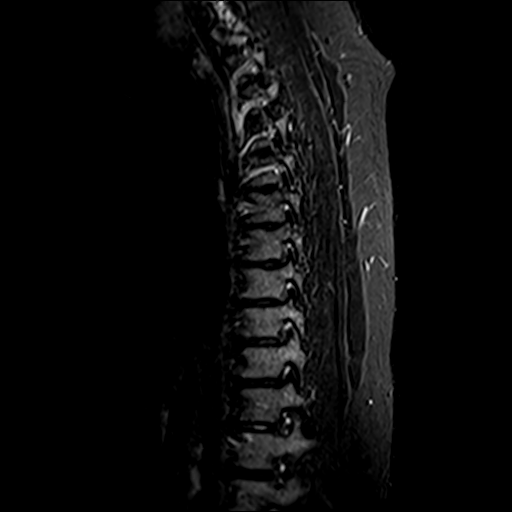
[im 20/20]
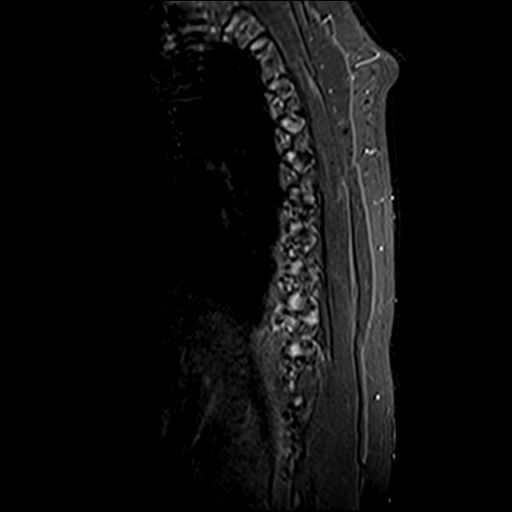

[Series 23: t2_axial_comp · axial · 3.5mm · 0.62mm/px · z∈[-300,-13]mm · 9 of 68 slices shown]
[im 1/68]
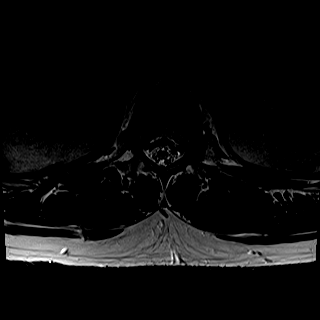
[im 5/68]
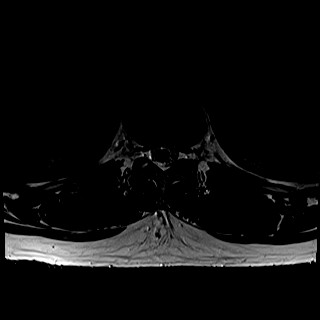
[im 14/68]
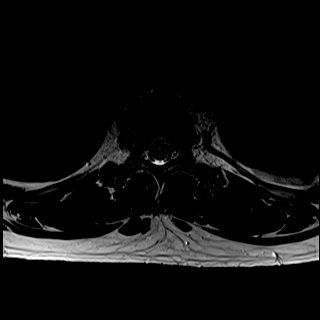
[im 23/68]
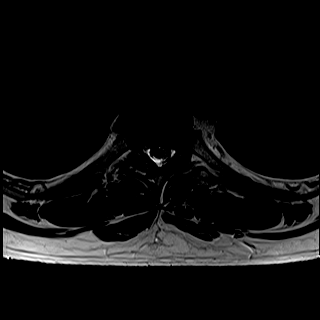
[im 32/68]
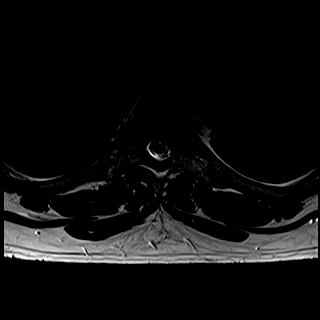
[im 41/68]
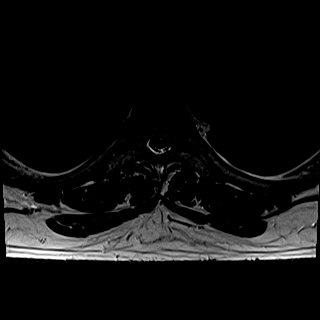
[im 50/68]
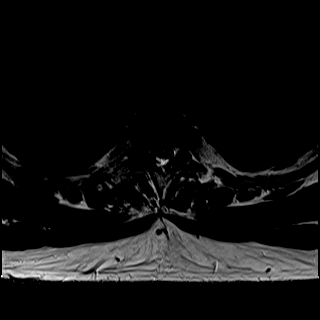
[im 59/68]
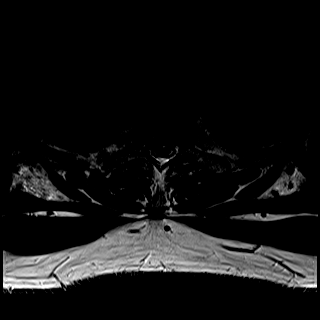
[im 68/68]
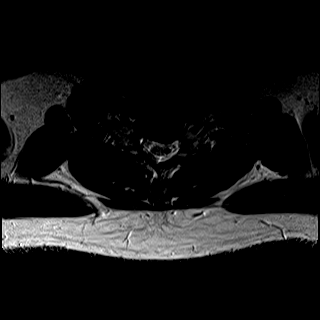

[41 of 48 positions shown; findings below may reference images not displayed]

FINDINGS: There is normal alignment of the vertebral elements. Disc desiccation is seen throughout the thoracic spine with decrease in disc space in keeping with mild degenerative disc disease. The
signal intensity of the bone marrow and thoracic neural cord is normal. Spinal canal is of normal caliber measuring approximately 14.19 mm anteroposteriorly.
At C7-T1 there is a central disc bulge indenting the dural sac producing no significant spinal canal or foraminal stenosis.
At T1-T2 there is a right paracentral disc protrusion projecting approximately 2.25 mm into the spinal canal producing no significant spinal canal or foraminal stenosis.
At T2-T3 there is no disc herniation, spinal canal or foraminal stenosis.
At T3-T4 there is a right paracentral disc protrusion measuring approximately 2 mm indenting the dural sac producing no significant spinal canal or foraminal stenosis.
At T4-T5 there is a central disc protrusion projecting approximately 2 mm indenting the dural sac producing no significant spinal canal or foraminal stenosis.
At T5-T6 there is a central disc bulge indenting the dural sac producing no significant spinal canal or foraminal stenosis.
At T6-T7 and T7-T8 there is no evidence of disc herniation, spinal canal or foraminal stenosis.
At T9-T10 and T10-11 there are central disc bulges with minimal facet arthropathy producing no significant spinal canal or foraminal stenosis.
At T11-T12 and T12-L1 there is no evidence of disc herniation, spinal canal or foraminal stenosis.
IMPRESSION: Mild degenerative disc disease of the thoracic spine producing no significant spinal canal or foraminal stenosis.

## 2020-08-22 IMAGING — MR MRI LSPINE WO CONTRAST
6 series · 43 of 48 positions shown · non-contrast
Comparison: Correlation is made to previous lumbar spine x-rays February 07, 2010 that show 5 lumbar-type vertebrae.

INDICATION: Mid and low back pain with right side radiculopathy.
TECHNIQUE: MRI lumbar spine without contrast. Sagittal and axial multisequence MR images of the lumbar spine were performed without intravenous contrast with additional coronal T1-weighted images.

[Series 11: iii_aaspine_lspine_mpr_cor · coronal · 1.7mm · 1.67mm/px · 17 of 80 slices shown]
[im 1/80]
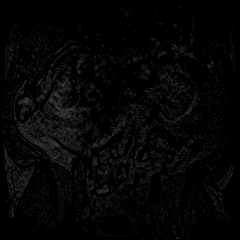
[im 5/80]
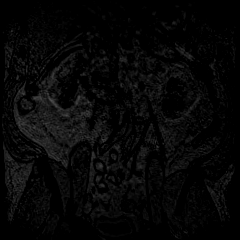
[im 9/80]
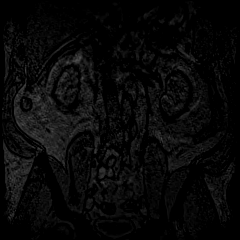
[im 14/80]
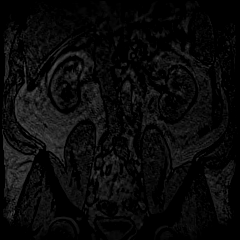
[im 18/80]
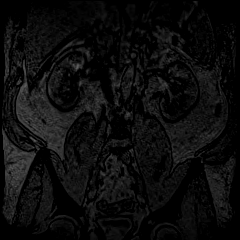
[im 22/80]
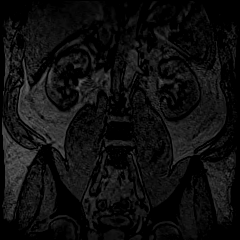
[im 27/80]
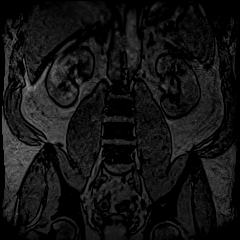
[im 31/80]
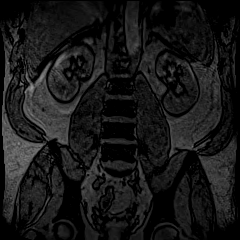
[im 36/80]
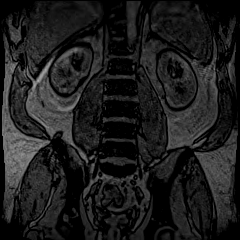
[im 40/80]
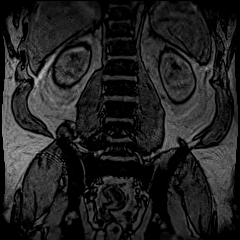
[im 44/80]
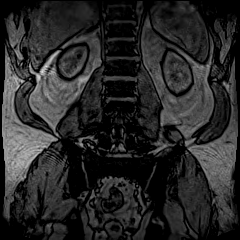
[im 49/80]
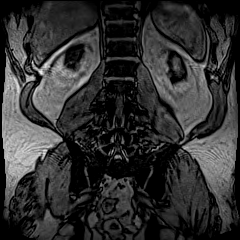
[im 53/80]
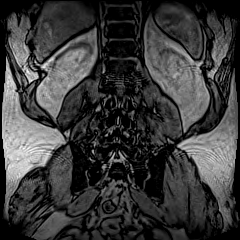
[im 58/80]
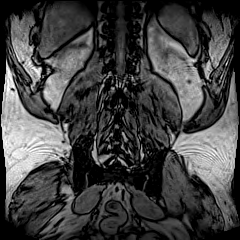
[im 62/80]
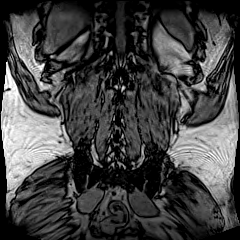
[im 66/80]
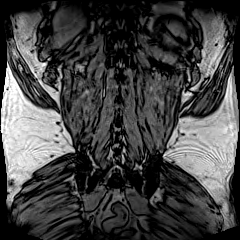
[im 75/80]
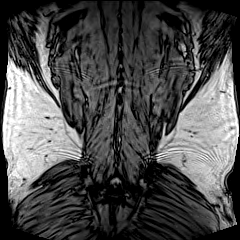

[Series 18: t2_sag · sagittal · 4.0mm · 0.68mm/px · 4 of 15 slices shown]
[im 1/15]
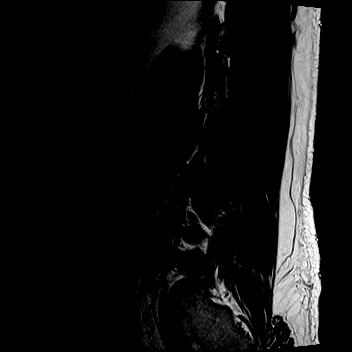
[im 5/15]
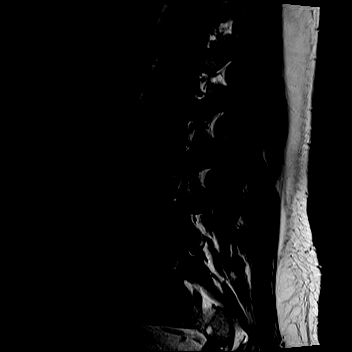
[im 10/15]
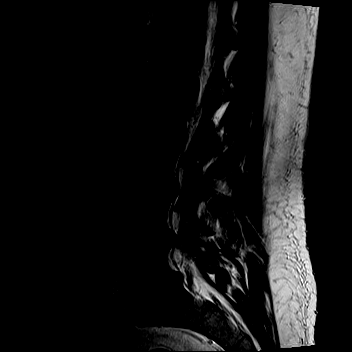
[im 15/15]
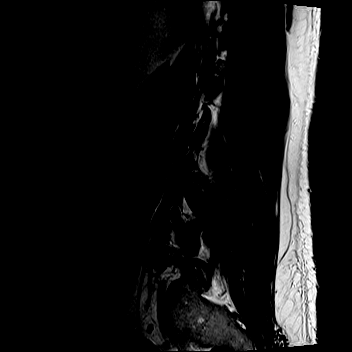

[Series 19: t1_sag · sagittal · 4.0mm · 0.75mm/px · 4 of 15 slices shown]
[im 1/15]
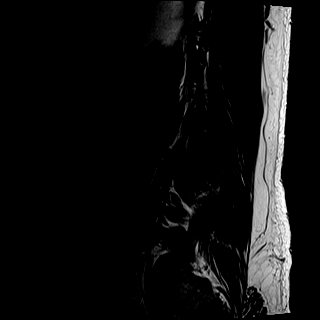
[im 5/15]
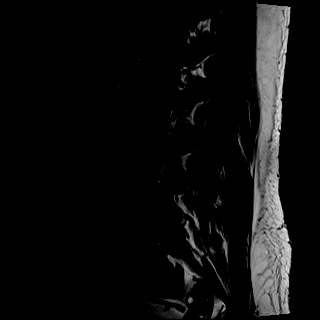
[im 10/15]
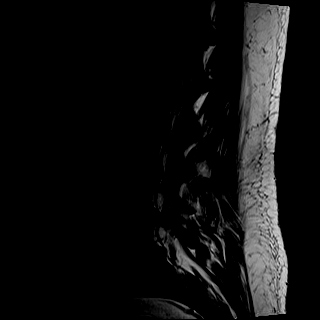
[im 15/15]
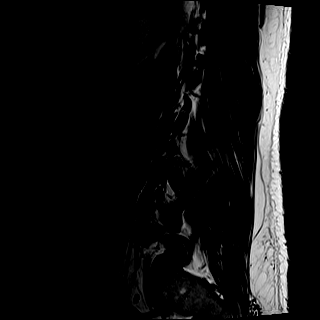

[Series 20: ir_sag · sagittal · 4.0mm · 0.94mm/px · 4 of 15 slices shown]
[im 1/15]
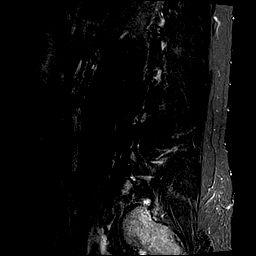
[im 5/15]
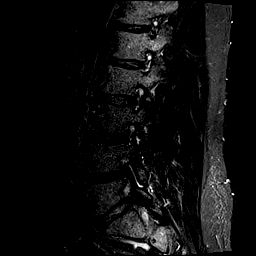
[im 10/15]
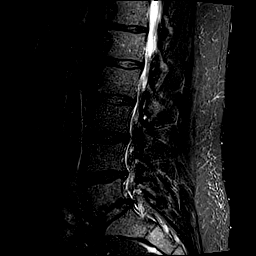
[im 15/15]
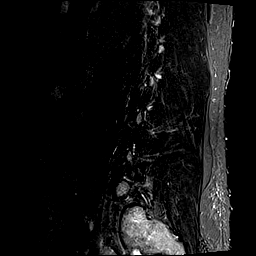

[Series 21: t2_axial · axial · 4.0mm · 0.62mm/px · z∈[-510,-304]mm · 8 of 44 slices shown]
[im 1/44]
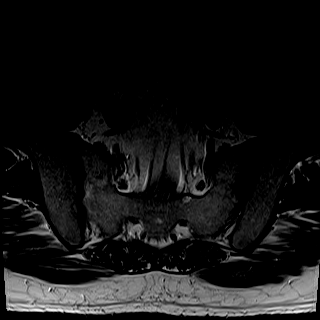
[im 9/44]
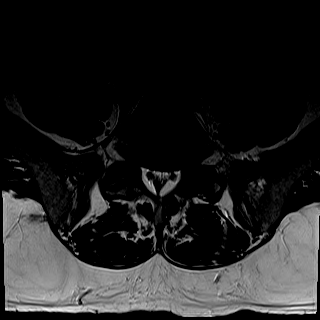
[im 13/44]
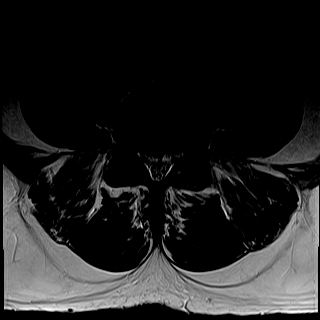
[im 18/44]
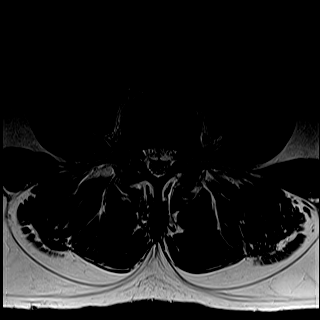
[im 26/44]
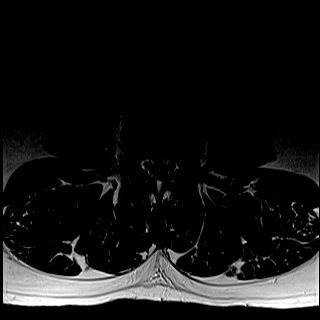
[im 31/44]
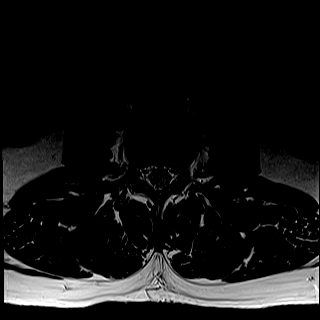
[im 35/44]
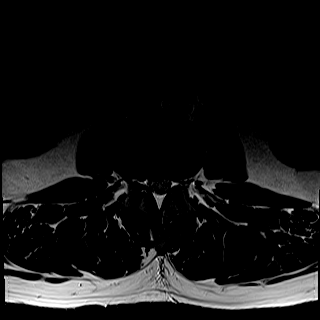
[im 44/44]
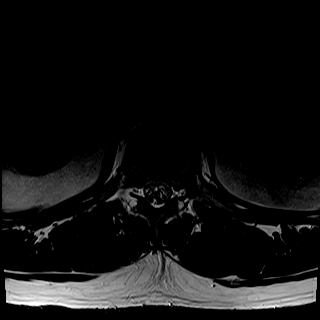

[Series 22: t1_axial_obl · oblique · 3.0mm · 0.43mm/px · 6 of 26 slices shown]
[im 1/26]
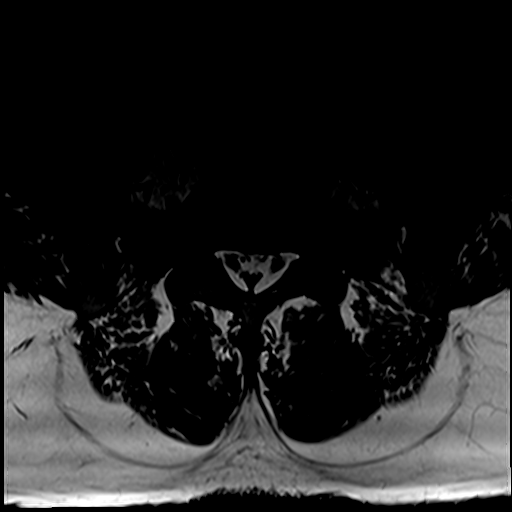
[im 6/26]
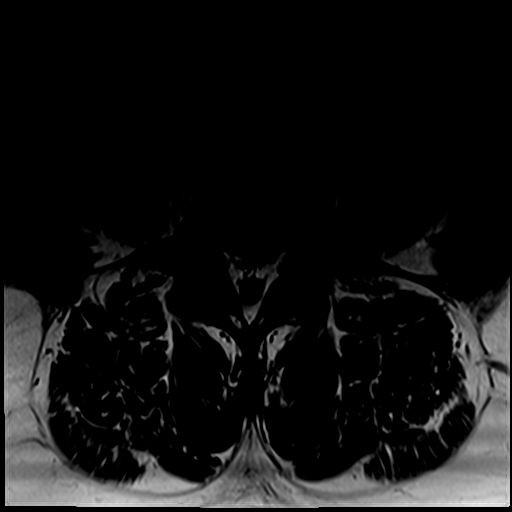
[im 11/26]
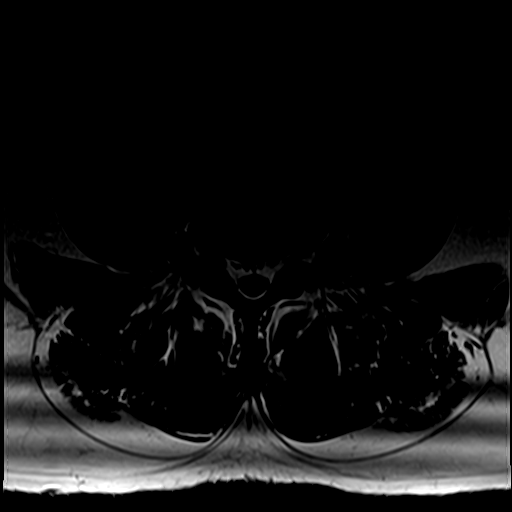
[im 16/26]
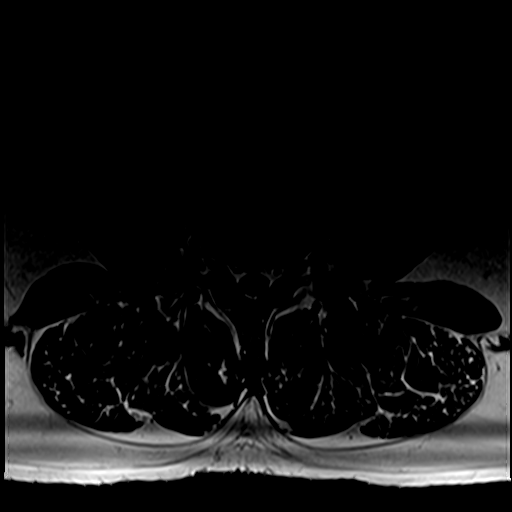
[im 21/26]
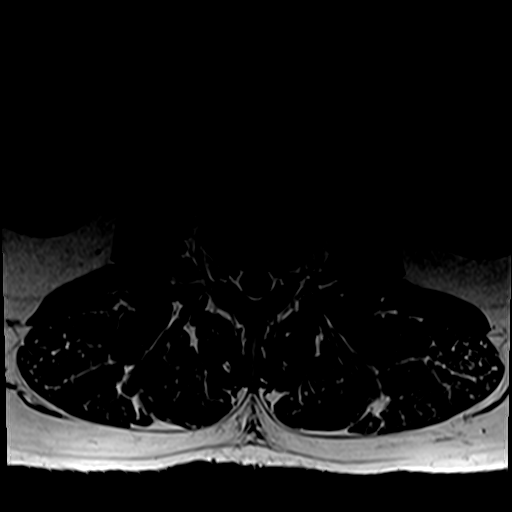
[im 26/26]
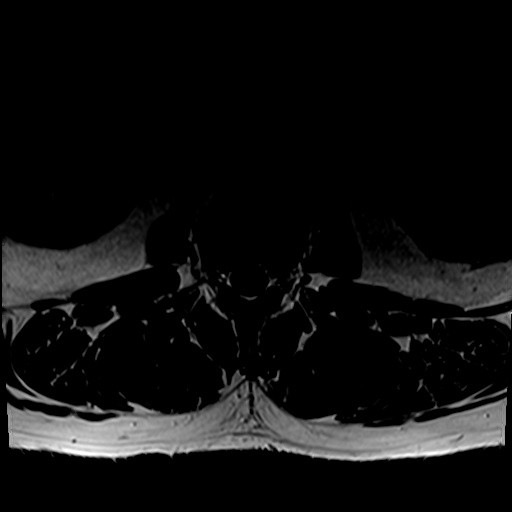

[43 of 48 positions shown; findings below may reference images not displayed]

FINDINGS: From the lateral scout image annotation patient has 7 cervical vertebrae, 12 thoracic vertebrae and 5 lumbar type vertebra.

There is mild straightening of the normal lumbar lordosis which may be positional or due to muscle spasm. Spinal canal measures 10.25 mm anteroposteriorly in keeping with congenital lumbar spinal canal stenosis. Mild posterior epidural lipomatosis is seen throughout the lumbar spine with mild circumferential epidural lipomatosis seen from the superior endplate of L4 down to the sacral spinal canal. The signal intensity of the bone marrow and distal neural cord is normal with normal conus ending at T12-L1 interspace level. Small anterior osteophytes are seen at T12-L1 and L4-L5 in keeping with mild spondylosis.

At T12-L1, L1-2 and L2-3 there is no disc herniation, spinal canal or foraminal stenosis.

At L3-L4 there is a broad disc bulge producing mild segmental spinal canal stenosis.

L4-L5 there is a broad disc bulge with mild bilateral facet arthropathy producing mild segmental spinal canal stenoses with moderate left and severe right foraminal stenosis.

L5-S1 there is a broad disc bulge with bilateral facet arthropathy producing mild segmental spinal canal stenoses with moderate bilateral intervertebral neural foraminal stenosis.

Mild bilateral degenerative changes of the sacroiliac joints are seen.
IMPRESSION: Mild spondylosis, degenerative disc disease and facet arthropathy with congenital lumbar spinal canal stenosis with epidural lipomatosis as described. This is producing spinal canal and foraminal stenosis from L3 down to S1 as described.

## 2021-02-23 IMAGING — US US BREAST RT LTD
1 series · 14 of 16 positions shown · non-contrast
Comparison: This is a baseline study.

HISTORY: Patient is 44 years old and is seen for diagnostic evaluation of mass in the right axilla. The patient has no personal history of cancer. The patient does not have a first degree relative with breast cancer.
TECHNIQUE: Bilateral 2-D digital diagnostic mammogram was performed followed by 3-D tomosynthesis. Real-time targeted ultrasound of the right breast was performed.  Current study was also evaluated with a computer aided detection (CAD) system.

[Series 1: us breast right ltd · 14 of 16 slices shown]
[im 1/16]
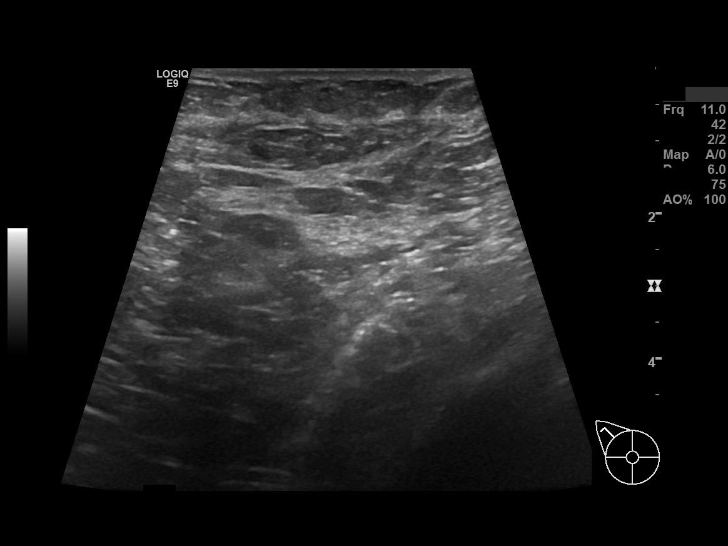
[im 2/16]
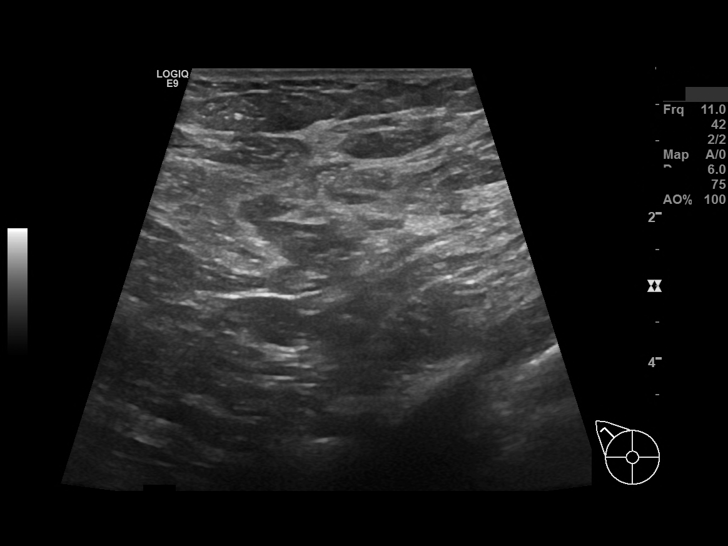
[im 3/16]
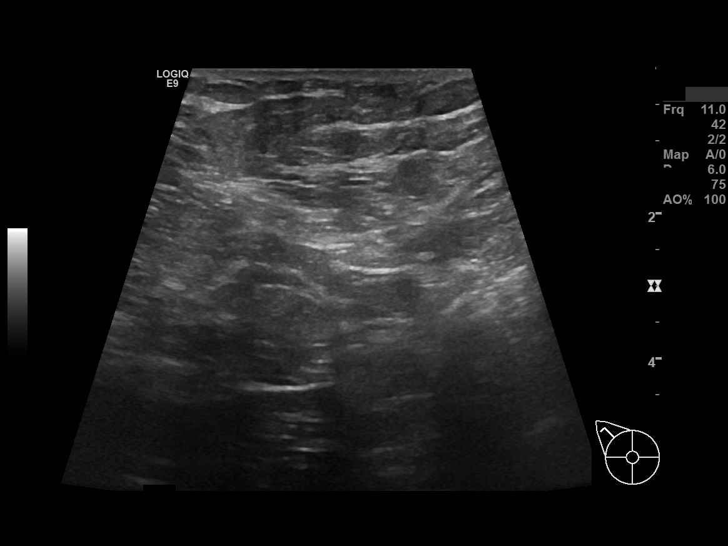
[im 5/16]
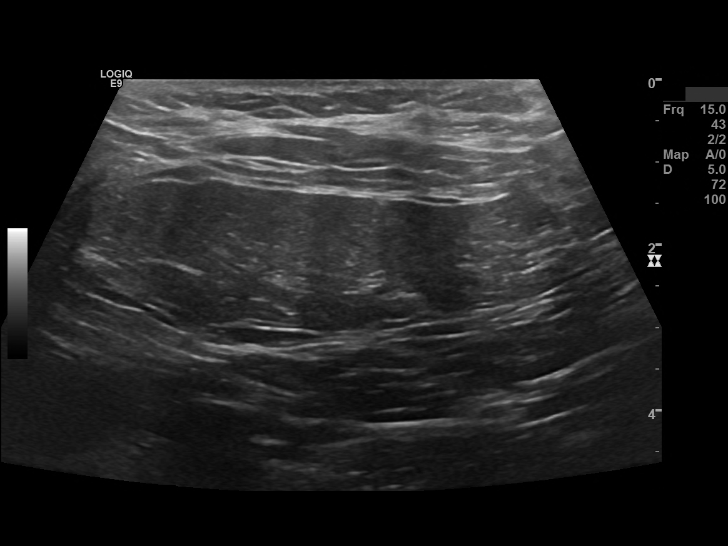
[im 6/16]
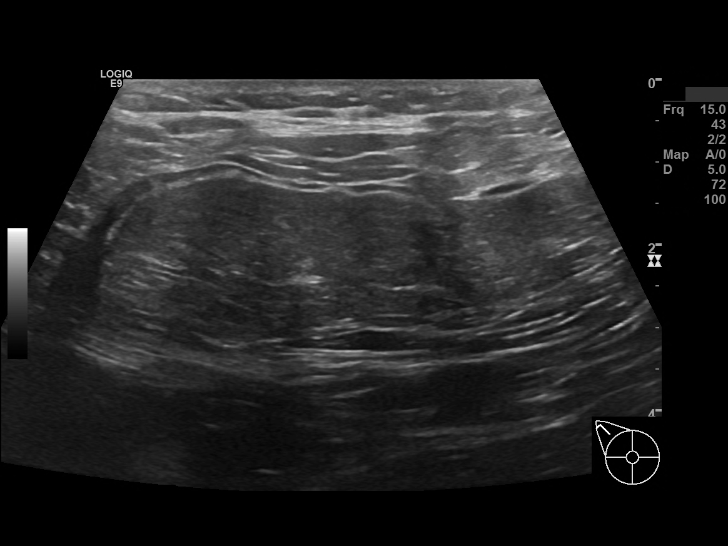
[im 7/16]
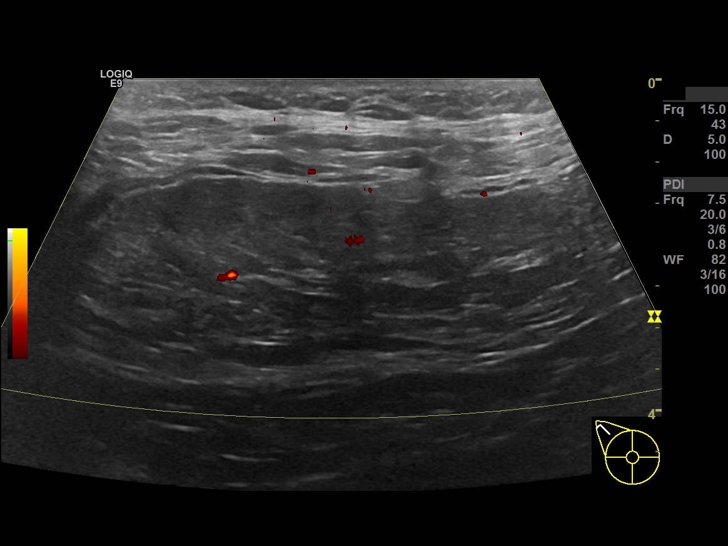
[im 8/16]
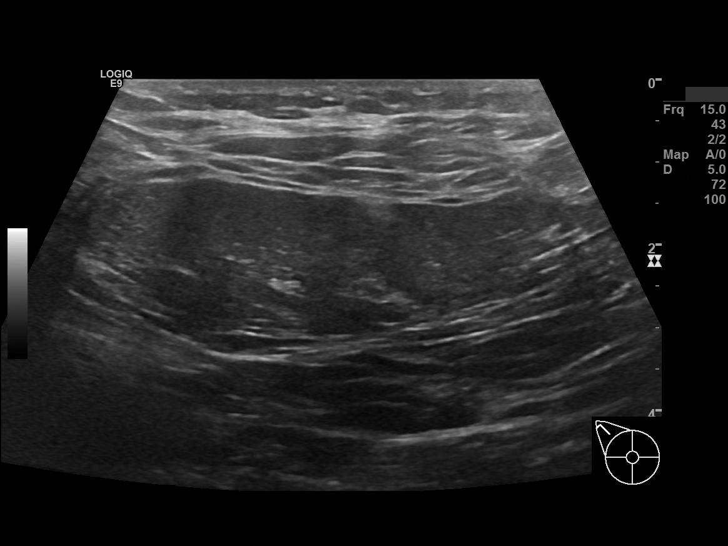
[im 9/16]
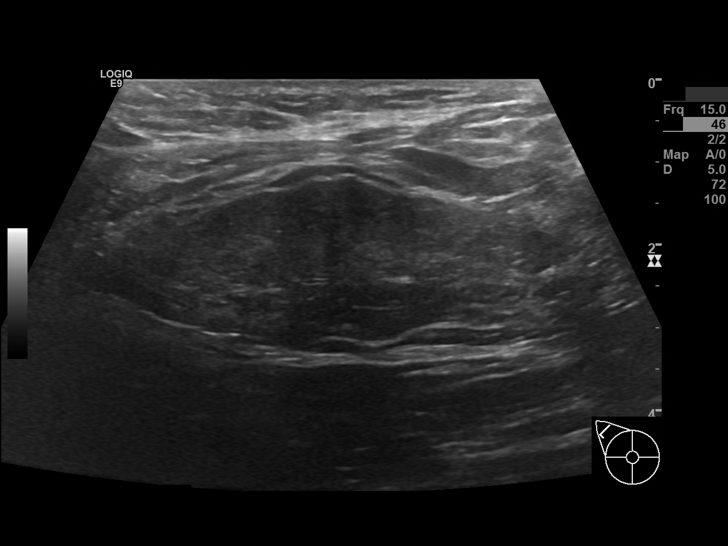
[im 10/16]
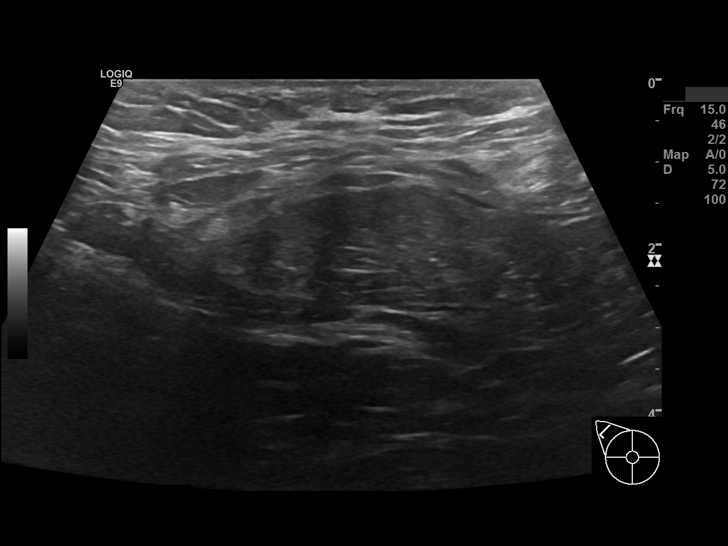
[im 11/16]
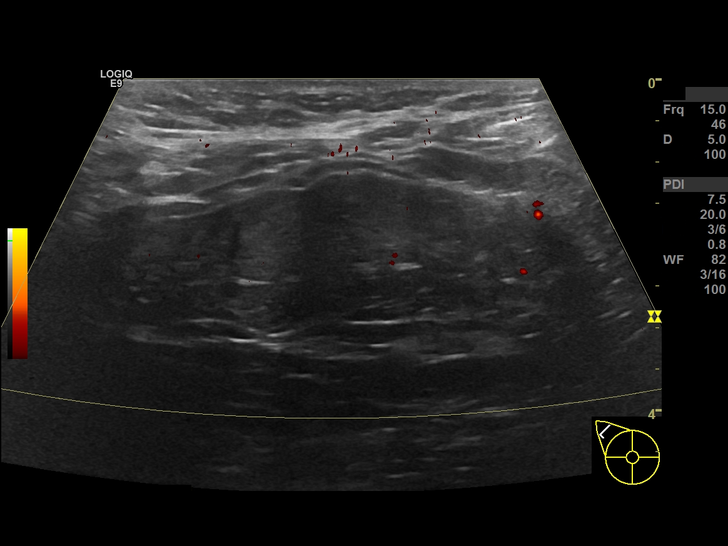
[im 13/16]
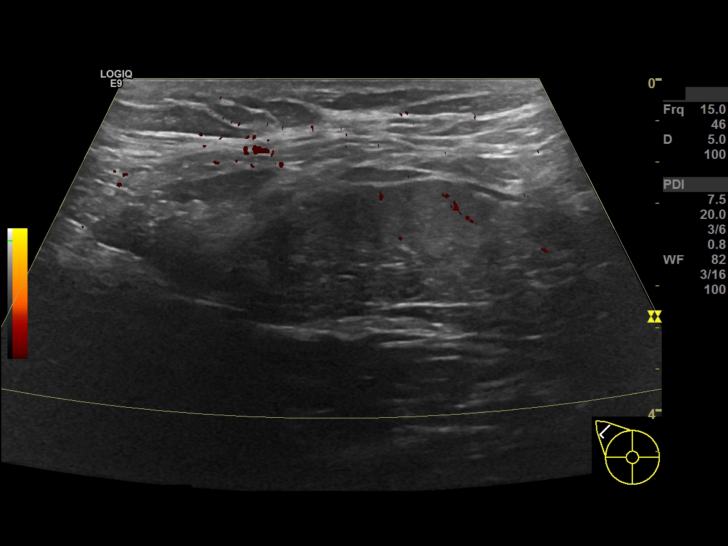
[im 14/16]
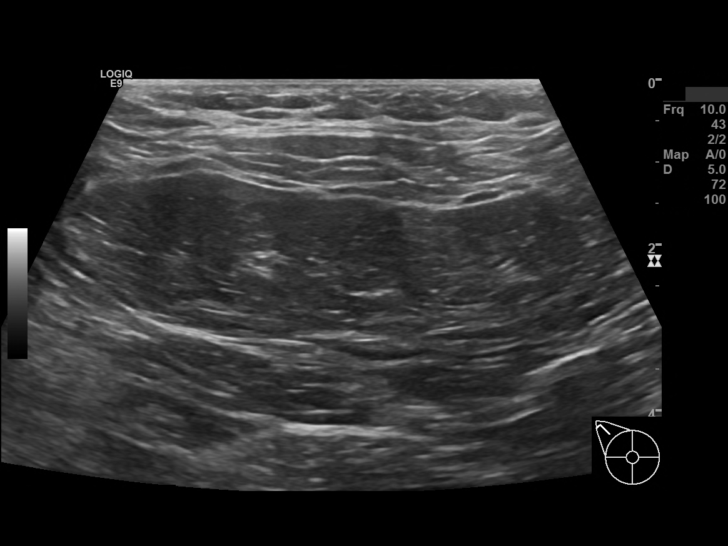
[im 15/16]
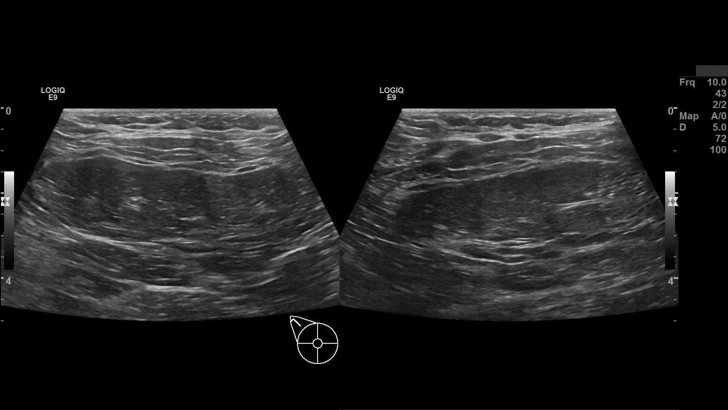
[im 16/16]
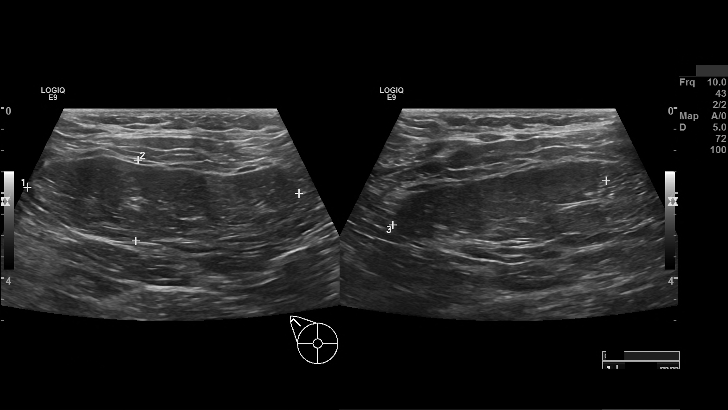

[14 of 16 positions shown; findings below may reference images not displayed]

MAMMOGRAM FINDINGS:

The breasts are almost entirely fatty. No suspicious abnormality is seen in either breast. The lipoma seen in the right axilla on today's ultrasound is mammographically occult.

ULTRASOUND FINDINGS:

Targeted ultrasound performed in the area of palpable lump in the right axilla demonstrates an isoechoic, oval, circumscribed, nonshadowing, avascular lipoma measuring 64 x 19 x 15 mm. There is no associated cystic component.
IMPRESSION: Palpable lump in the right axilla corresponds to a benign-appearing lipoma measuring 64 mm. Recommend clinical correlation.

The patient received a copy of the results at the end of the examination.

BI-RADS Category 2: Benign

## 2021-02-23 IMAGING — MG MAMMO DIAG BIL W/CAD TOMO
6 of 10 series · 6 of 30 positions shown · non-contrast
Comparison: This is a baseline study.

HISTORY: Patient is 44 years old and is seen for diagnostic evaluation of mass in the right axilla. The patient has no personal history of cancer. The patient does not have a first degree relative with breast cancer.
TECHNIQUE: Bilateral 2-D digital diagnostic mammogram was performed followed by 3-D tomosynthesis. Real-time targeted ultrasound of the right breast was performed.  Current study was also evaluated with a computer aided detection (CAD) system.

[R XCCL]
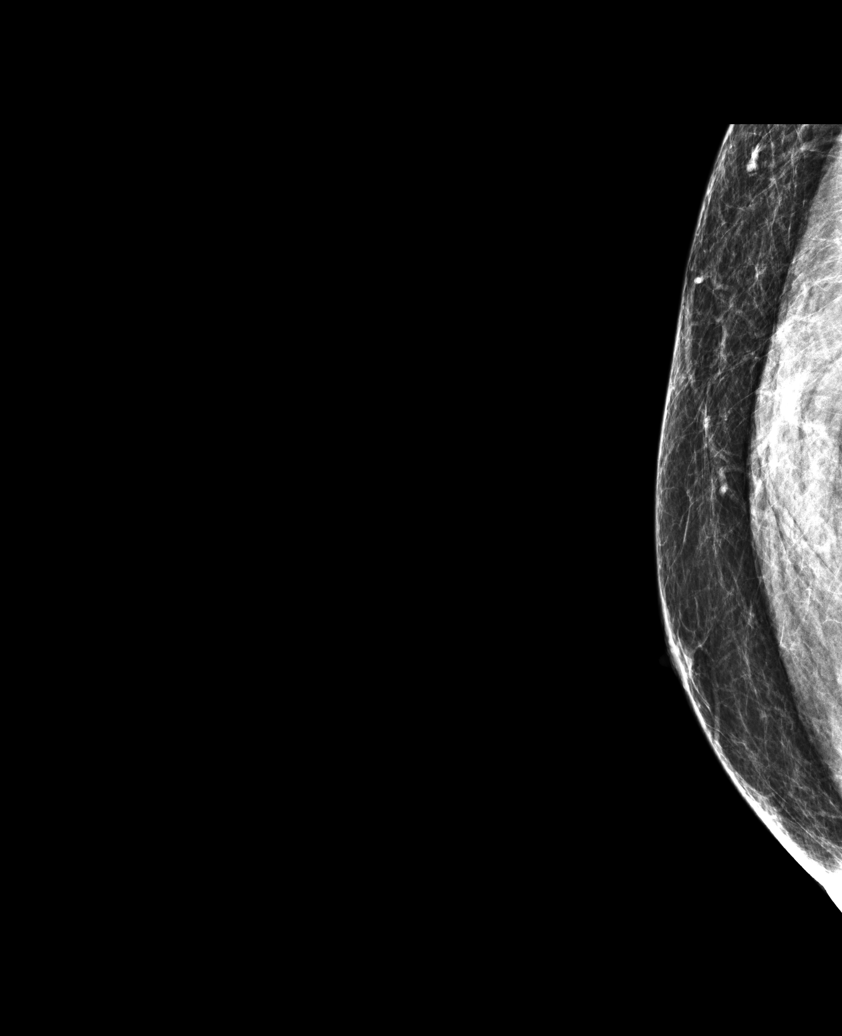

[R CC]
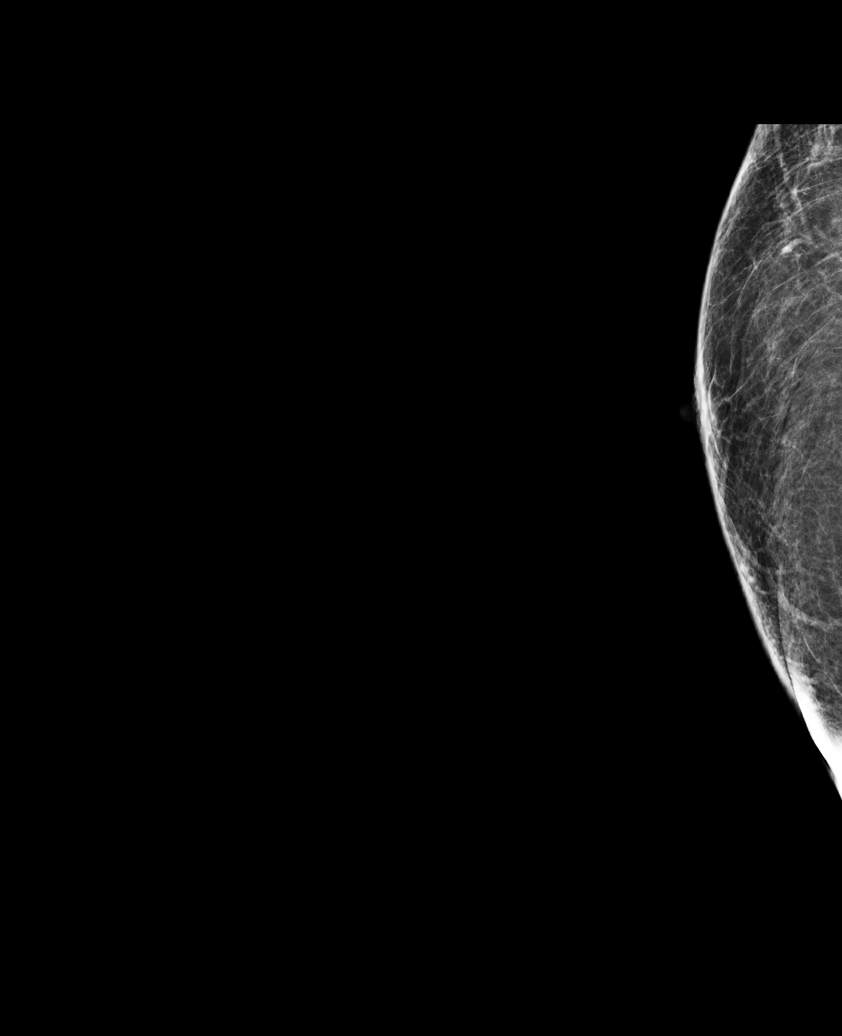

[R MLO]
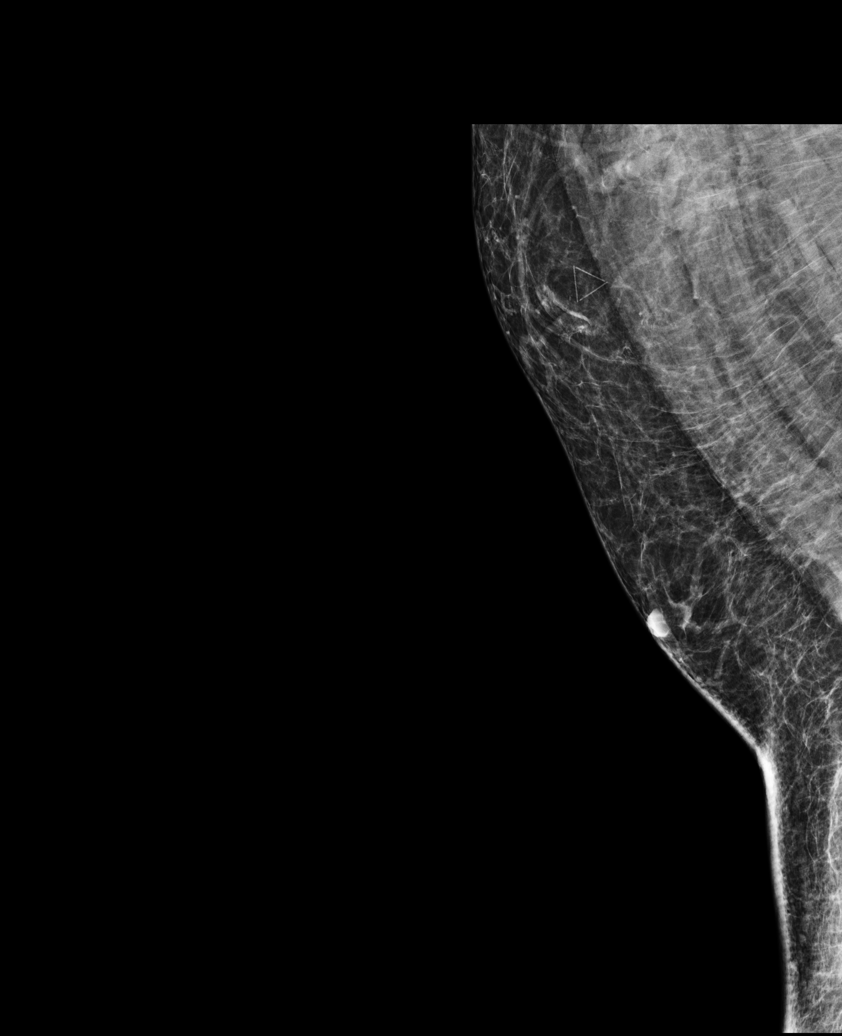

[L MLO]
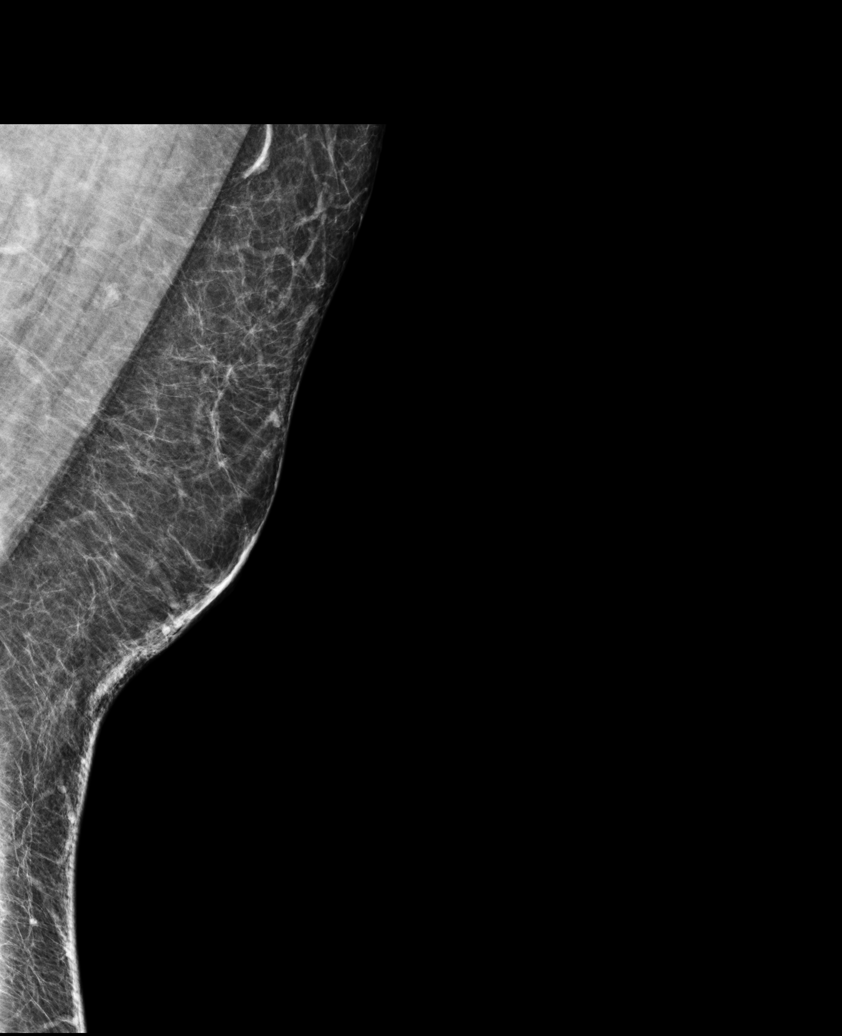

[L CC]
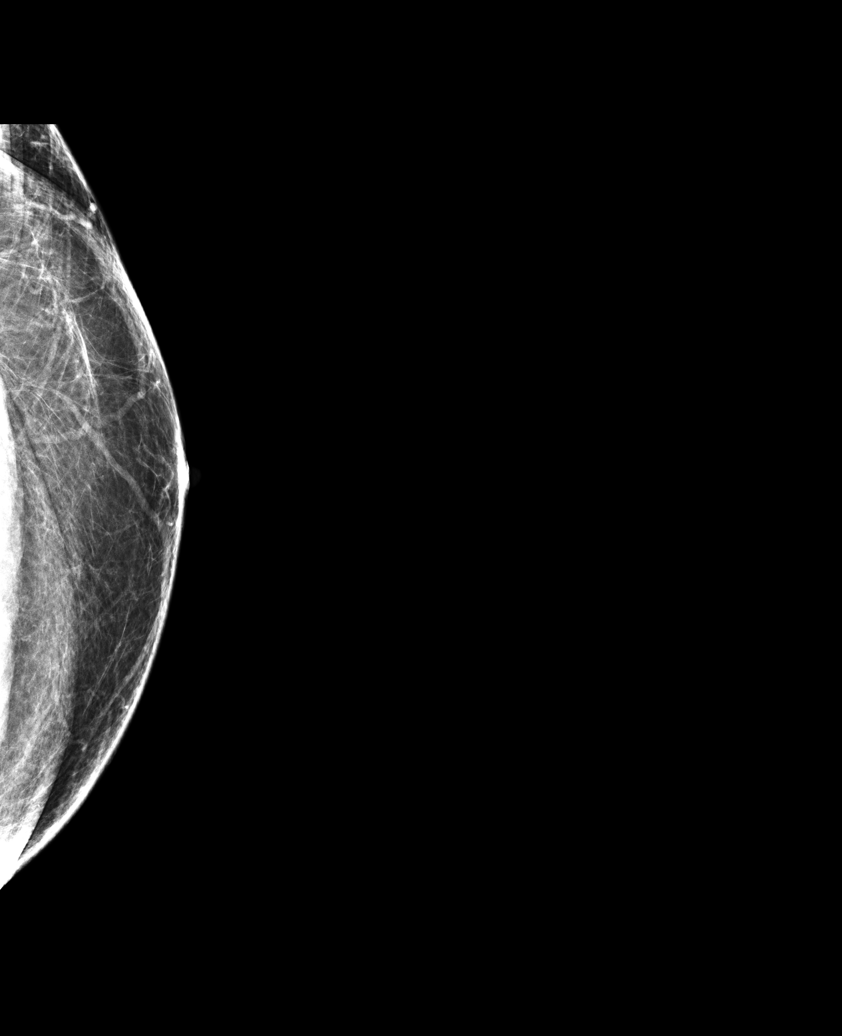

[R XCCL tomo · tomo slice 36/71.0]
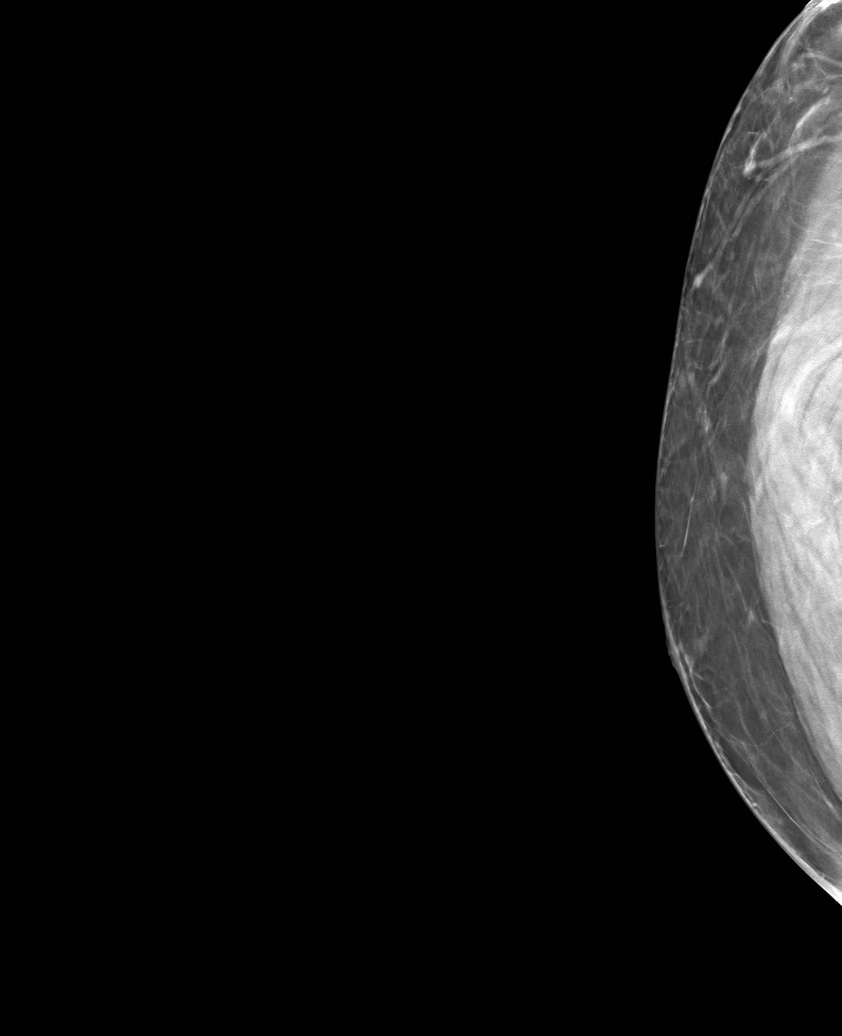

[6 of 30 positions shown; findings below may reference images not displayed]

MAMMOGRAM FINDINGS:

The breasts are almost entirely fatty. No suspicious abnormality is seen in either breast. The lipoma seen in the right axilla on today's ultrasound is mammographically occult.

ULTRASOUND FINDINGS:

Targeted ultrasound performed in the area of palpable lump in the right axilla demonstrates an isoechoic, oval, circumscribed, nonshadowing, avascular lipoma measuring 64 x 19 x 15 mm. There is no associated cystic component.
IMPRESSION: Palpable lump in the right axilla corresponds to a benign-appearing lipoma measuring 64 mm. Recommend clinical correlation.

The patient received a copy of the results at the end of the examination.

BI-RADS Category 2: Benign

## 2021-09-06 IMAGING — US DOP CAROTID BILATERAL
1 series · 13 of 24 positions shown · non-contrast
Comparison: None

HISTORY: 45-year-old male with bruit, symptoms and signs involving the circulatory and respiratory systems
TECHNIQUE: Gray-scale color Doppler and spectral Doppler imaging of the bilateral carotid and vertebral arteries is performed.

[Series 1: dop carotid bilateral · 13 of 50 slices shown]
[im 1/50]
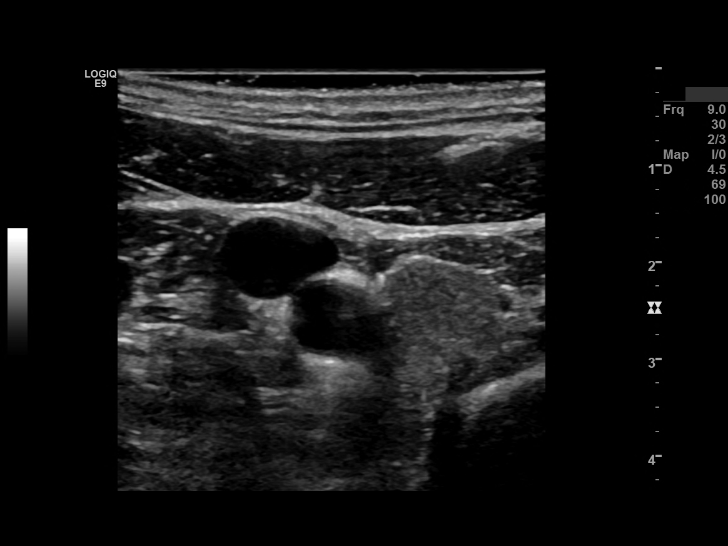
[im 5/50]
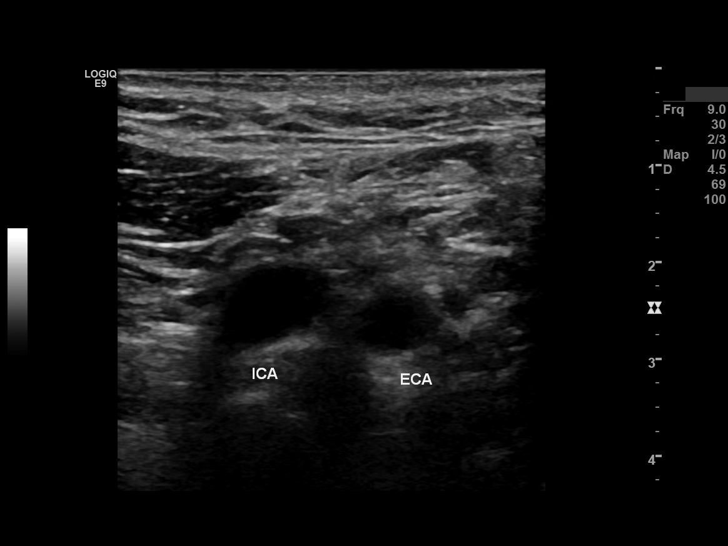
[im 9/50]
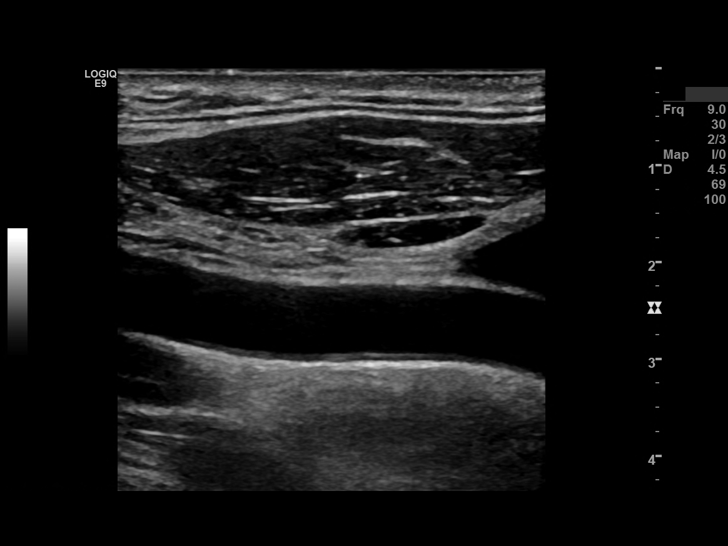
[im 13/50]
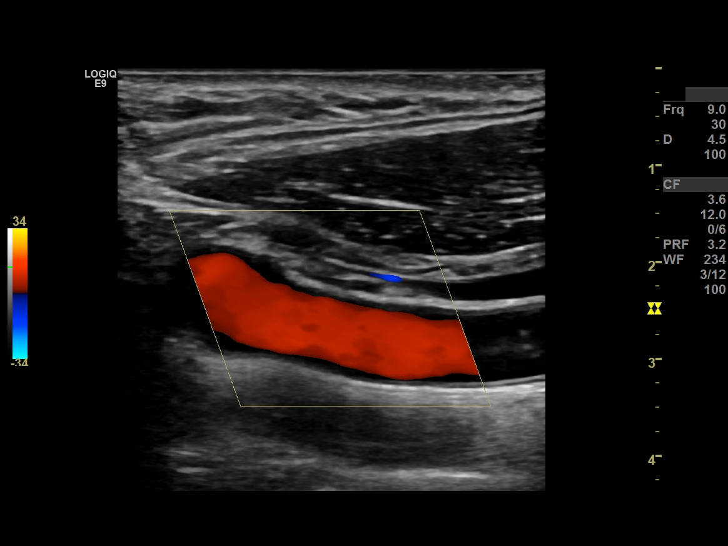
[im 18/50]
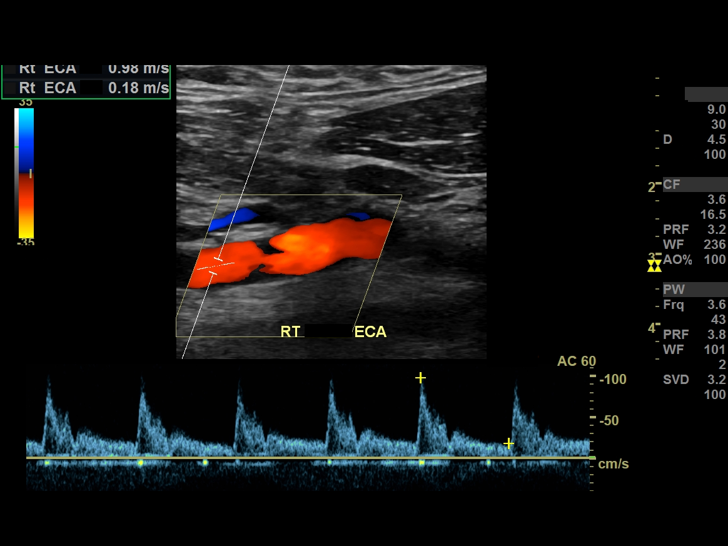
[im 22/50]
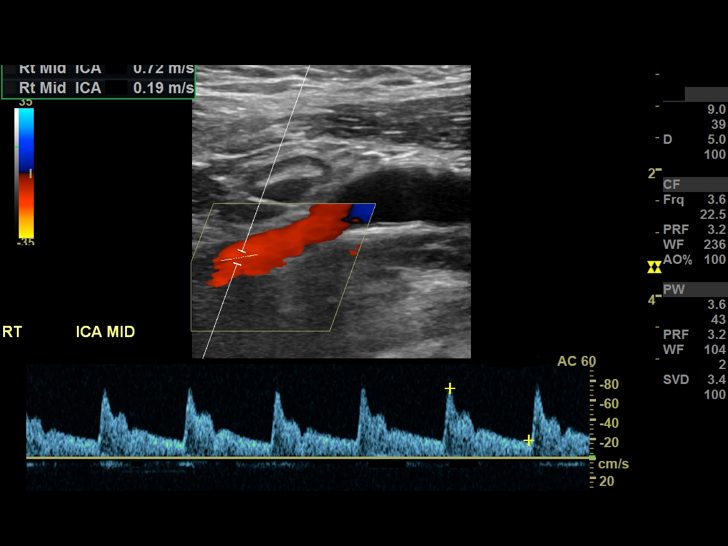
[im 26/50]
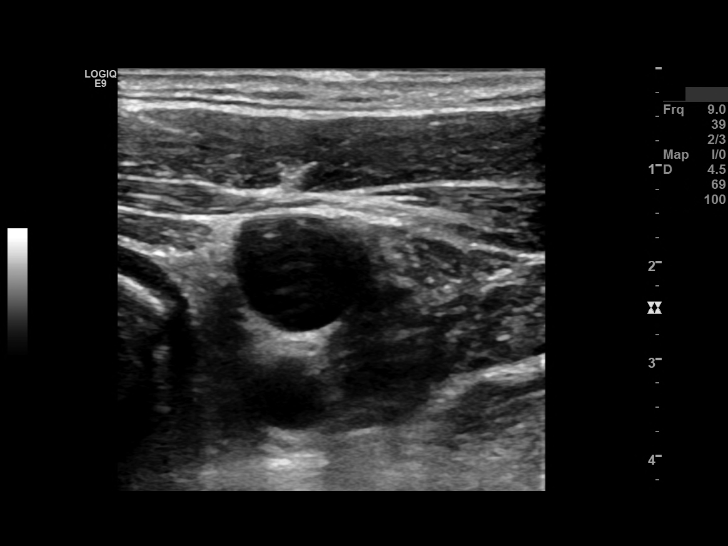
[im 28/50]
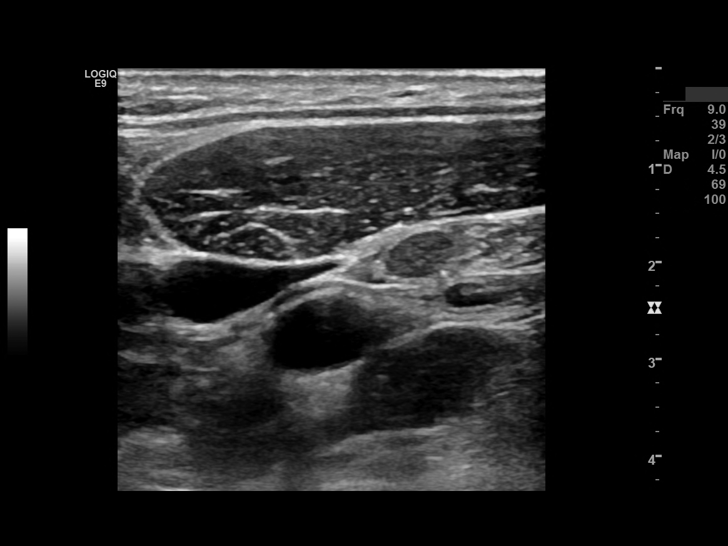
[im 32/50]
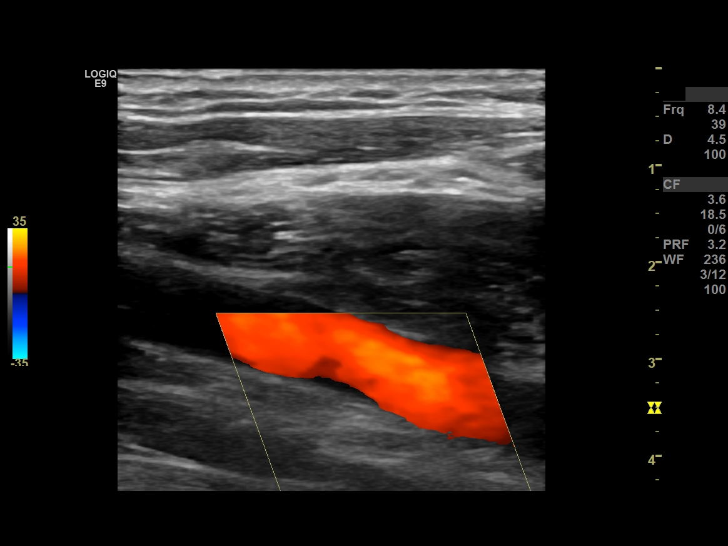
[im 37/50]
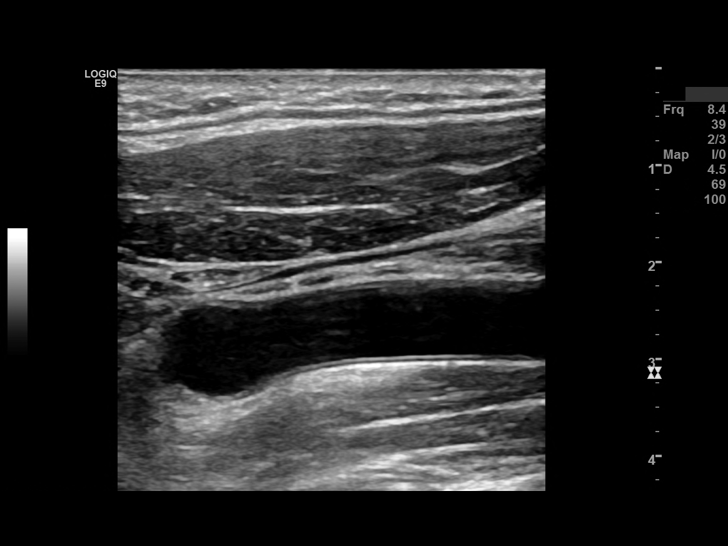
[im 41/50]
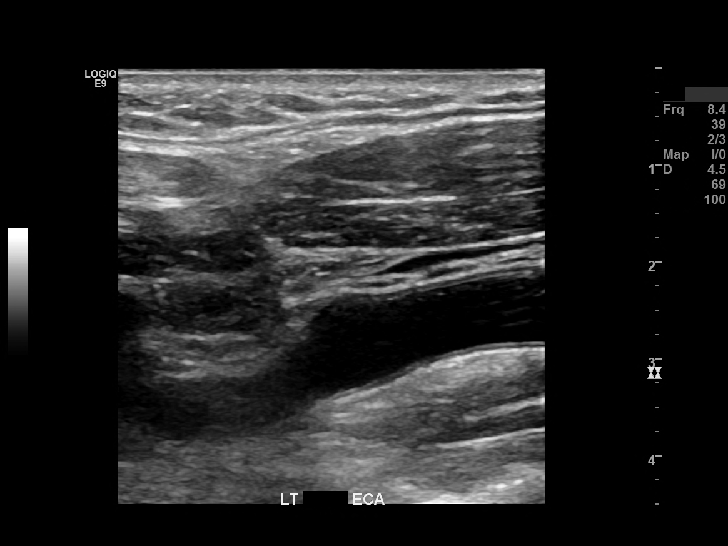
[im 45/50]
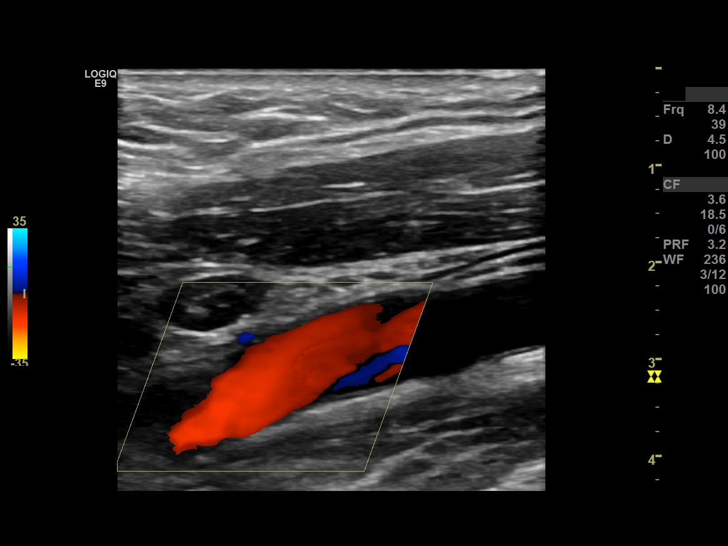
[im 50/50]
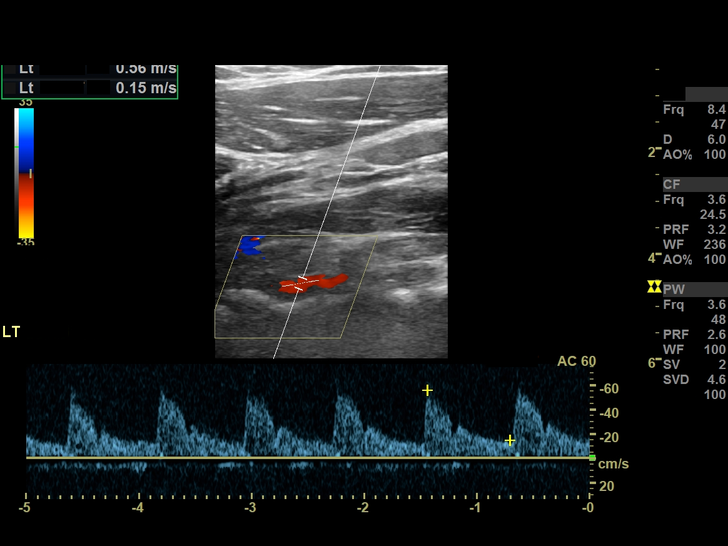

[13 of 24 positions shown; findings below may reference images not displayed]

FINDINGS: Gray-scale soft/calcified carotid plaques:  There are no significant plaques identified in both carotid arteries. 

CARDIAC RHYTHM: REGULAR

RIGHT CAROTID ARTERY:

ICA peak systolic velocity: 0.75  m/s

CCA peak systolic velocity: 1.94 m/s

ICA/CCA Ratio:

ICA end-diastolic velocity: 0.16 m/s

ECA velocity:  0.98 m/s

Vertebral artery peak systolic velocity: 0.32 m/s

Vertebral artery direction: ANTEGRADE

LEFT CAROTID ARTERY:

ICA peak systolic velocity: 1.18 m/s

CCA peak systolic velocity: 1.56 m/s

ICA/CCA Ratio:

ICA end-diastolic velocity: 0.26 m/s

ECA velocity:  1.13 m/s

Vertebral artery peak systolic velocity: 0.56 m/s

Vertebral artery direction: ANTEGRADE
IMPRESSION: 1.  CARDIAC RHYTHM: REGULAR

2.  Gray-scale soft/calcified carotid plaques:  There are no significant plaques identified in both carotid arteries. 

3.  No hemodynamically significant stenosis of the carotid arteries.

Stenosis velocity criteria are extrapolated from diameter data as defined by the Society of Radiologists in Ultrasound Consensus Conference Radiology 1669; 229; 304-346.

## 2021-10-31 IMAGING — CR CHEST 2 VWS PA LAT
1 series · 2 of 2 positions shown · non-contrast
Comparison: 07/08/06

HISTORY/INDICATIONS:  Shortness of breath.
TECHNIQUE: Chest 2 views.

[Series 1: w chest pa · 0.15mm/px · 2 of 2 slices shown]
[im 1/2]
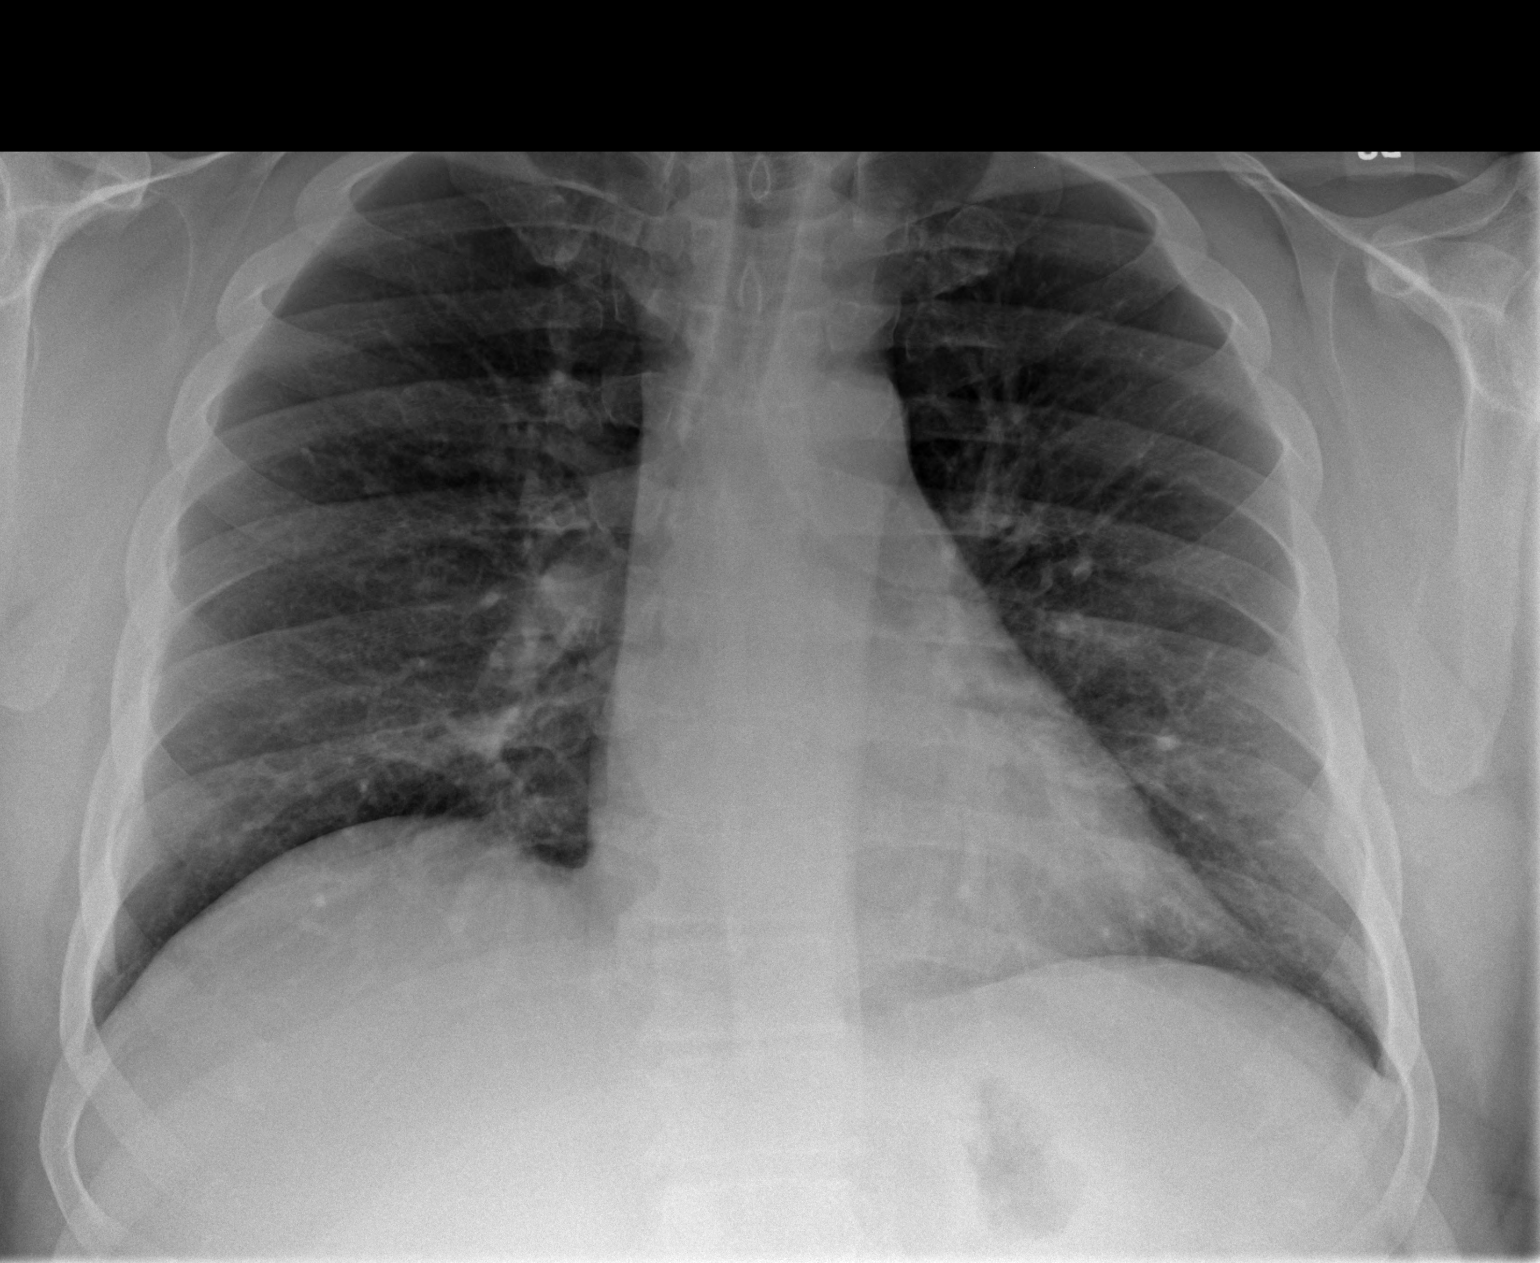
[im 2/2]
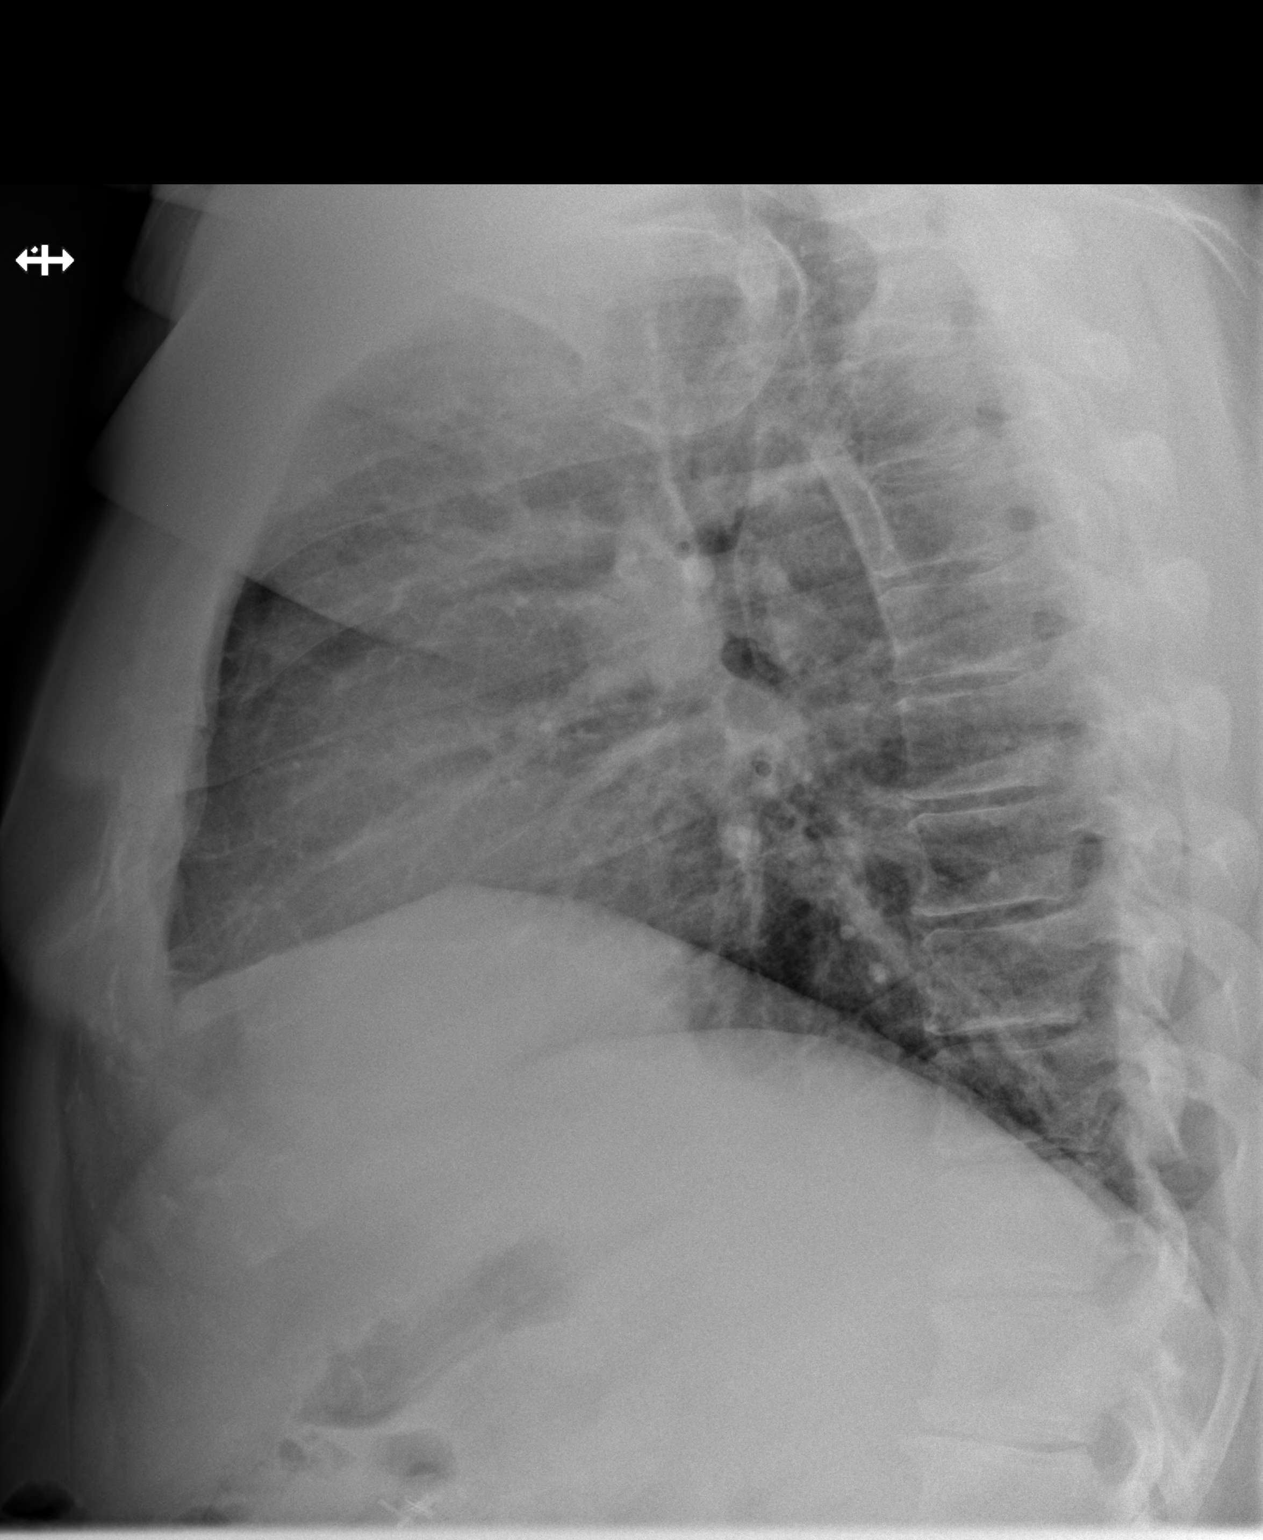

[2 of 2 positions shown; findings below may reference images not displayed]

FINDINGS: Cardiac size and pulmonary vascularity are normal.  The mediastinal silhouette is unremarkable.  The lungs are normal, and there is no pleural fluid.
IMPRESSION: Stable chest.

## 2023-05-15 IMAGING — CT CT THORAX W CONTRAST
2 of 5 series · 7 of 36 positions shown, 8 images · IV contrast (isovue)
Comparison: None
Coronary Artery Calcification: Absent

Images Obtained from [facility]
HISTORY: Pain following right chest surgery.
TECHNIQUE: Scans of the chest are performed at 3 mm increments with 1 mm lung reconstructions and coronal and sagittal reconstructions, following intravenous administration of 80 mL of Isovue 300.
Siemens nodule finding program was used to assist pulmonary nodule detection.
Dose reduction technique used: Automated exposure control and adjustment of the mA and/or kV according to patient size. CT Studies and Cardiac Nuclear Medicine Studies in last 12-months = 0
Total radiation dose to patient is CTDIvol 5.87 mGy and DLP 238.00 mGy-cm.

[Series 2: soft tissue · axial · 0.58mm/px · z∈[+1586,+1811]mm · 4 of 125 slices shown, 5 images]
[im 25/125  mediastinal]
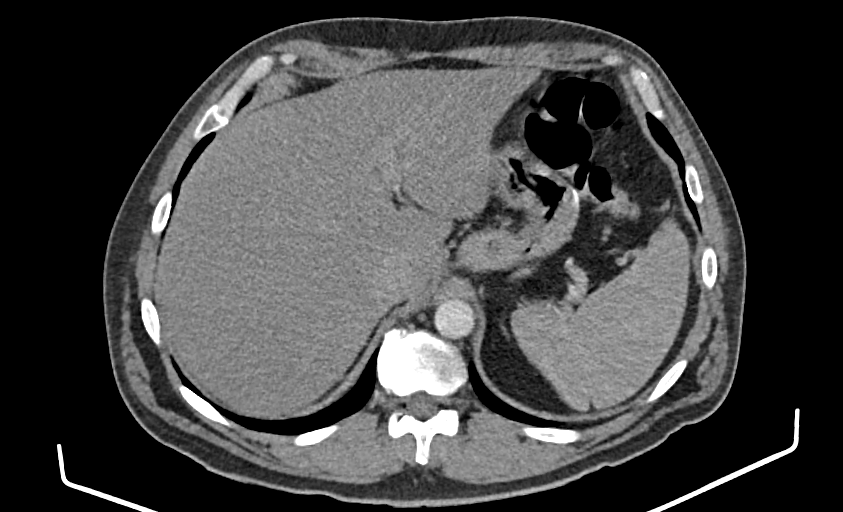
[im 25/125  lung]
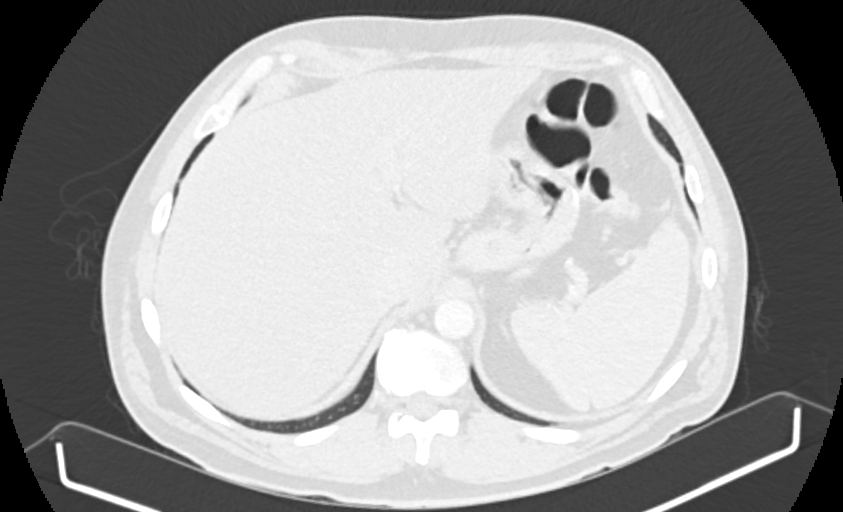
[im 50/125  lung]
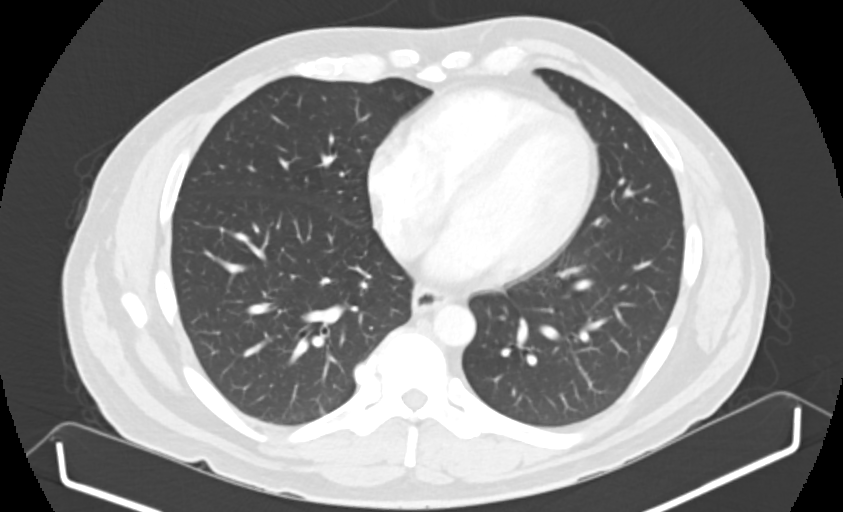
[im 75/125  lung]
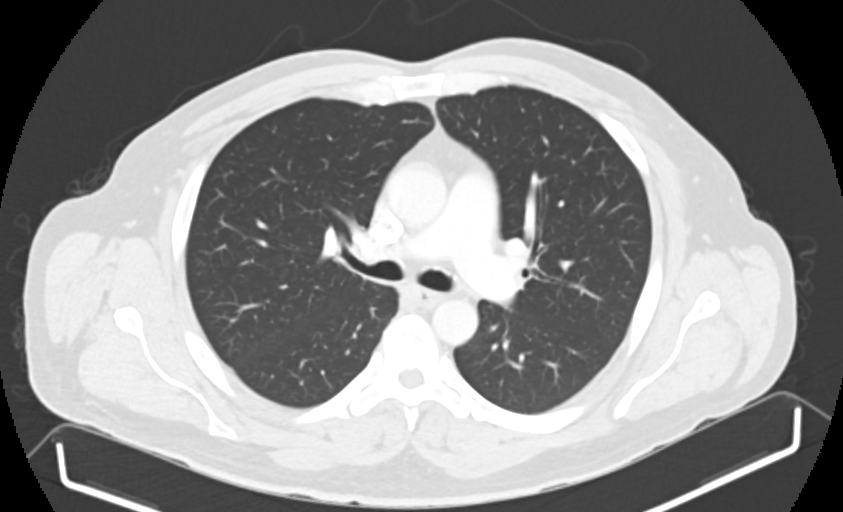
[im 100/125  lung]
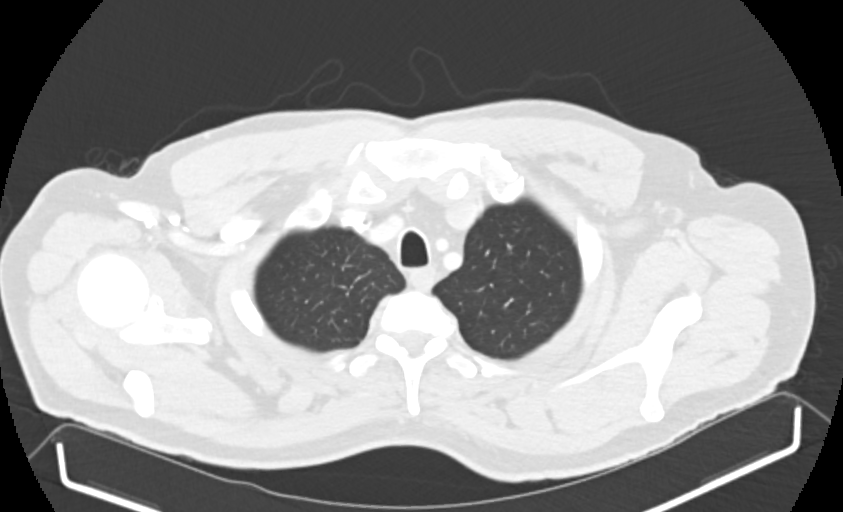

[Series 4: coronal · coronal · 0.73mm/px · 3 of 297 slices shown]
[im 60/297  lung]
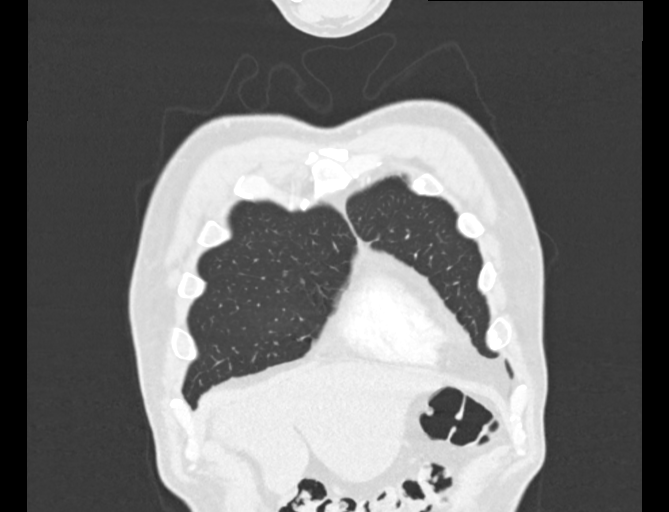
[im 119/297  lung]
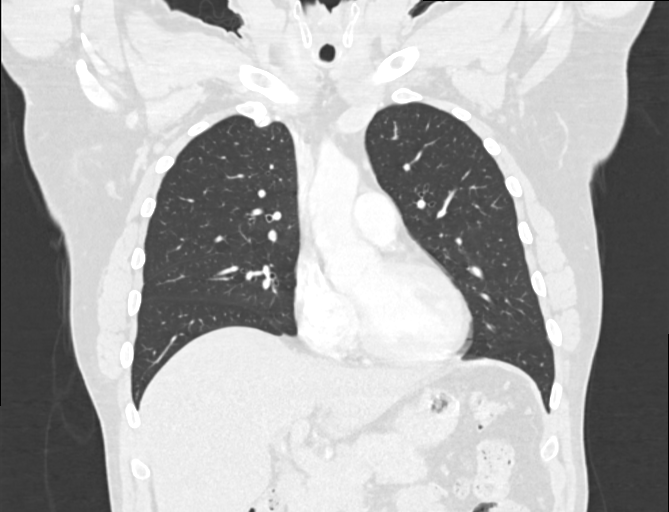
[im 178/297  lung]
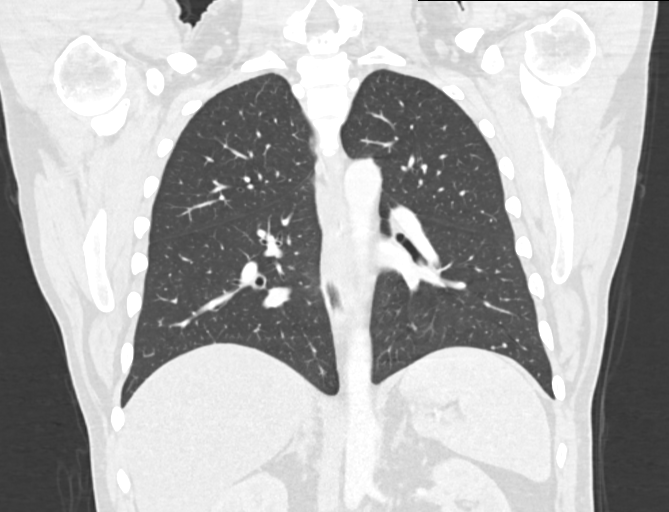

[7 of 36 positions shown; findings below may reference images not displayed]

FINDINGS: The chest wall, axillary regions and thoracic inlet are normal. Normal mediastinal and hilar vascular anatomy is present with no adenopathy or mass. Heart size is normal. Hiatal hernia with
postsurgical changes of gastric sleeve procedure are seen.
Lungs are clear, with no nodule, mass, infiltrate or pleural findings. Coronal and sagittal lung reconstructions show no additional findings mild degenerative change is seen in disc spaces of the
thoracic spine without fracture. No upper abdominal findings are seen other than for mild fatty infiltration of liver with liver density of 44 HU.
IMPRESSION: 1.  CT of chest is negative for acute or significant findings.
2.  Postop gastric sleeve procedure with hiatal hernia.
3.  Mild degenerative change in spine.
4.  Mild fatty infiltration of liver.
